# Patient Record
Sex: Male | Born: 1960 | Hispanic: No | Marital: Married | Smoking: Current every day smoker
Health system: Southern US, Community
[De-identification: ages and names within clinical notes are randomized; demographics above are authoritative.]

## PROBLEM LIST (undated history)

## (undated) ENCOUNTER — Emergency Department (HOSPITAL_COMMUNITY): Discharge status: Absent without leave | Payer: No Typology Code available for payment source

## (undated) DIAGNOSIS — E8881 Metabolic syndrome: Secondary | ICD-10-CM

## (undated) DIAGNOSIS — F17200 Nicotine dependence, unspecified, uncomplicated: Secondary | ICD-10-CM

## (undated) DIAGNOSIS — E119 Type 2 diabetes mellitus without complications: Secondary | ICD-10-CM

## (undated) DIAGNOSIS — E559 Vitamin D deficiency, unspecified: Secondary | ICD-10-CM

## (undated) DIAGNOSIS — E291 Testicular hypofunction: Secondary | ICD-10-CM

## (undated) DIAGNOSIS — E7800 Pure hypercholesterolemia, unspecified: Secondary | ICD-10-CM

## (undated) DIAGNOSIS — M1990 Unspecified osteoarthritis, unspecified site: Secondary | ICD-10-CM

## (undated) DIAGNOSIS — I10 Essential (primary) hypertension: Secondary | ICD-10-CM

## (undated) DIAGNOSIS — E669 Obesity, unspecified: Secondary | ICD-10-CM

## (undated) HISTORY — PX: COLONOSCOPY W/ BIOPSIES AND POLYPECTOMY: SHX1376

## (undated) HISTORY — PX: APPENDECTOMY: SHX54

---

## 1998-09-01 ENCOUNTER — Ambulatory Visit (HOSPITAL_BASED_OUTPATIENT_CLINIC_OR_DEPARTMENT_OTHER): Admission: RE | Admit: 1998-09-01 | Discharge: 1998-09-01 | Payer: Self-pay | Admitting: Plastic Surgery

## 2008-02-05 HISTORY — PX: ANKLE FRACTURE SURGERY: SHX122

## 2009-06-18 ENCOUNTER — Emergency Department (HOSPITAL_BASED_OUTPATIENT_CLINIC_OR_DEPARTMENT_OTHER): Admission: EM | Admit: 2009-06-18 | Discharge: 2009-06-18 | Payer: Self-pay | Admitting: Emergency Medicine

## 2009-06-18 ENCOUNTER — Ambulatory Visit: Payer: Self-pay | Admitting: Radiology

## 2009-10-04 ENCOUNTER — Encounter
Admission: RE | Admit: 2009-10-04 | Discharge: 2010-01-02 | Payer: Self-pay | Source: Home / Self Care | Admitting: Specialist

## 2010-01-03 ENCOUNTER — Encounter
Admission: RE | Admit: 2010-01-03 | Discharge: 2010-01-31 | Payer: Self-pay | Source: Home / Self Care | Attending: Specialist | Admitting: Specialist

## 2010-01-31 ENCOUNTER — Encounter
Admission: RE | Admit: 2010-01-31 | Discharge: 2010-02-26 | Payer: Self-pay | Source: Home / Self Care | Attending: Specialist | Admitting: Specialist

## 2011-12-02 ENCOUNTER — Emergency Department (HOSPITAL_BASED_OUTPATIENT_CLINIC_OR_DEPARTMENT_OTHER)
Admission: EM | Admit: 2011-12-02 | Discharge: 2011-12-02 | Disposition: A | Discharge status: Home / Self Care | Payer: Managed Care, Other (non HMO) | Attending: Emergency Medicine | Admitting: Emergency Medicine

## 2011-12-02 ENCOUNTER — Encounter (HOSPITAL_BASED_OUTPATIENT_CLINIC_OR_DEPARTMENT_OTHER): Payer: Self-pay | Admitting: Emergency Medicine

## 2011-12-02 ENCOUNTER — Emergency Department (HOSPITAL_BASED_OUTPATIENT_CLINIC_OR_DEPARTMENT_OTHER): Payer: Managed Care, Other (non HMO)

## 2011-12-02 DIAGNOSIS — F1010 Alcohol abuse, uncomplicated: Secondary | ICD-10-CM | POA: Insufficient documentation

## 2011-12-02 DIAGNOSIS — R42 Dizziness and giddiness: Secondary | ICD-10-CM | POA: Insufficient documentation

## 2011-12-02 DIAGNOSIS — K219 Gastro-esophageal reflux disease without esophagitis: Secondary | ICD-10-CM | POA: Insufficient documentation

## 2011-12-02 DIAGNOSIS — R079 Chest pain, unspecified: Secondary | ICD-10-CM | POA: Insufficient documentation

## 2011-12-02 LAB — COMPREHENSIVE METABOLIC PANEL
AST: 27 U/L (ref 0–37)
Alkaline Phosphatase: 57 U/L (ref 39–117)
CO2: 24 mEq/L (ref 19–32)
Creatinine, Ser: 0.7 mg/dL (ref 0.50–1.35)
GFR calc Af Amer: >90 mL/min (ref >90)
GFR calc non Af Amer: >90 mL/min (ref >90)
Sodium: 139 mEq/L (ref 135–145)
Total Protein: 7.1 g/dL (ref 6.0–8.3)

## 2011-12-02 LAB — CBC WITH DIFFERENTIAL/PLATELET
Basophils Absolute: 0 10*3/uL (ref 0.0–0.1)
HCT: 44 % (ref 39.0–52.0)
Hemoglobin: 15.1 g/dL (ref 13.0–17.0)
Lymphocytes Relative: 29 % (ref 12–46)
Lymphs Abs: 2.2 10*3/uL (ref 0.7–4.0)
MCH: 32.3 pg (ref 26.0–34.0)
Monocytes Absolute: 0.5 10*3/uL (ref 0.1–1.0)
Neutrophils Relative %: 63 % (ref 43–77)
Platelets: 264 10*3/uL (ref 150–400)
RBC: 4.67 MIL/uL (ref 4.22–5.81)
RDW: 13.2 % (ref 11.5–15.5)

## 2011-12-02 LAB — D-DIMER, QUANTITATIVE: D-Dimer, Quant: <0.27 ug/mL-FEU (ref 0.00–0.48)

## 2011-12-02 LAB — LIPASE, BLOOD: Lipase: 45 U/L (ref 11–59)

## 2011-12-02 LAB — TROPONIN I: Troponin I: <0.3 ng/mL (ref <0.30)

## 2011-12-02 MED ORDER — GI COCKTAIL ~~LOC~~
30.0000 mL | Freq: Once | ORAL | Status: AC
  Administered 2011-12-02: 30 mL via ORAL
  Filled 2011-12-02: qty 30

## 2011-12-02 NOTE — ED Notes (Signed)
Patient back form Xray

## 2011-12-02 NOTE — ED Provider Notes (Signed)
History     CSN: [id number]  Arrival date & time 12/02/11  0802   None     Chief Complaint  Patient presents with  . Chest Pain  . Dizziness    (Consider location/radiation/quality/duration/timing/severity/associated sxs/prior treatment) Patient is a 51 y.o. male presenting with chest pain. The history is provided by the patient.  Chest Pain The chest pain began 3 - 5 days ago. Duration of episode(s) is 3 days. Chest pain occurs constantly. The chest pain is unchanged (waxes and wanes in severity). Associated with: when he moves his arms a certain way it hurts worse. At its most intense, the pain is at 6/10. The pain is currently at 1/10. The severity of the pain is mild. The quality of the pain is described as dull (and then when it is more pronounced it is sharp). The pain radiates to the upper back. Chest pain is worsened by certain positions. Primary symptoms include dizziness. Pertinent negatives for primary symptoms include no fever, no shortness of breath, no cough, no wheezing, no abdominal pain, no nausea and no vomiting.  Dizziness does not occur with nausea, vomiting or weakness.   Pertinent negatives for associated symptoms include no lower extremity edema and no weakness. He tried nothing for the symptoms. Risk factors include smoking/tobacco exposure and male gender.  Pertinent negatives for past medical history include no CAD, no diabetes, no hyperlipidemia, no hypertension and no MI.  Pertinent negatives for family medical history include: no CAD in family.  Procedure history is negative for cardiac catheterization, stress echo and exercise treadmill test.     No past medical history on file.  No past surgical history on file.  No family history on file.  History  Substance Use Topics  . Smoking status: Not on file  . Smokeless tobacco: Not on file  . Alcohol Use: Not on file      Review of Systems  Constitutional: Negative for fever.  Respiratory:  Negative for cough, shortness of breath and wheezing.   Cardiovascular: Positive for chest pain.  Gastrointestinal: Negative for nausea, vomiting and abdominal pain.  Neurological: Positive for dizziness. Negative for weakness.  All other systems reviewed and are negative.    Allergies  Review of patient's allergies indicates not on file.  Home Medications  No current outpatient prescriptions on file.  There were no vitals taken for this visit.  Physical Exam  Nursing note and vitals reviewed. Constitutional: He is oriented to person, place, and time. He appears well-developed and well-nourished. No distress.       Central obesity  HENT:  Head: Normocephalic and atraumatic.  Mouth/Throat: Oropharynx is clear and moist.  Eyes: Conjunctivae normal and EOM are normal. Pupils are equal, round, and reactive to light.  Neck: Normal range of motion. Neck supple.  Cardiovascular: Normal rate, regular rhythm and intact distal pulses.   No murmur heard. Pulmonary/Chest: Effort normal and breath sounds normal. No respiratory distress. He has no wheezes. He has no rales. He exhibits no tenderness.  Abdominal: Soft. He exhibits no distension. There is no tenderness. There is no rebound and no guarding.  Musculoskeletal: Normal range of motion. He exhibits no edema and no tenderness.  Neurological: He is alert and oriented to person, place, and time.  Skin: Skin is warm and dry. No rash noted. No erythema.  Psychiatric: He has a normal mood and affect. His behavior is normal.    ED Course  Procedures (including critical care time)  Labs Reviewed  COMPREHENSIVE METABOLIC PANEL - Abnormal; Notable for the following:    Glucose, Bld 200 (*)     Total Bilirubin 0.1 (*)     All other components within normal limits  CBC WITH DIFFERENTIAL  LIPASE, BLOOD  D-DIMER, QUANTITATIVE  TROPONIN I  TROPONIN I   Dg Chest 2 View  12/02/2011  *RADIOLOGY REPORT*  Clinical Data: Chest pain.   Hypertension.  CHEST - 2 VIEW  Comparison: No priors.  Findings: Lung volumes are normal.  No consolidative airspace disease.  No pleural effusions.  Calcified nodule in the left mid lung (likely in the left upper lobe) is compatible with a small calcified granuloma.  No other or suspicious appearing pulmonary nodules or masses are confidently identified.  Heart size is normal.  No evidence of pulmonary edema.  Upper mediastinal contours are within normal limits.  Atherosclerosis of the thoracic aorta.  IMPRESSION: 1.  No radiographic evidence of acute cardiopulmonary disease. 2.  Atherosclerosis. 3.  Calcified granuloma in the left midlung incidentally noted.   Original Report Authenticated By: Florencia Reasons, M.D.      Date: 12/02/2011  Rate: 64  Rhythm: normal sinus rhythm, occasional PVC  QRS Axis: normal  Intervals: normal  ST/T Wave abnormalities: nonspecific ST/T changes  Conduction Disutrbances:right bundle branch block  Narrative Interpretation:   Old EKG Reviewed: none available   1. Chest pain   2. GERD (gastroesophageal reflux disease)       MDM   Patient presenting complaining of 3 days of chest pain without associated symptoms except for mild lightheadedness that he states comes and goes. The chest pain has been constant but severity waxes and wanes. He is a TIMI 0 and his only risk factor is that he is a smoker. He also admits to being a fairly heavy drinker however denies any symptom worsening with eating. He has no abdominal pain or vomiting making pancreatitis less likely. The pain does not radiate down into the abdomen or across his chest concerning for a dissection. Concern for PE versus cardiac pathology. Patient denies dark stools or bleeding and there's no sign of anemia by exam. EKG, d-dimer, CBC, CMP, lipase, troponin pending.  12:03 PM All labs and 2 troponins are negative. Patient symptoms are completely resolved after GI cocktail. Spoke with patient's PCP  and he will followup with him about the elevated blood sugar and outpatient stress testing.     Gwyneth Sprout, MD 12/02/11 1204

## 2011-12-02 NOTE — ED Notes (Signed)
Pt c/o midsternal chest pain w/ lightheadedness x 3 days.

## 2011-12-02 NOTE — ED Notes (Signed)
Patient transported to X-ray 

## 2011-12-02 NOTE — ED Notes (Signed)
MD at bedside. 

## 2012-04-01 ENCOUNTER — Encounter (HOSPITAL_COMMUNITY): Payer: Self-pay | Admitting: Anesthesiology

## 2012-04-01 ENCOUNTER — Encounter (HOSPITAL_COMMUNITY)
Admission: EM | Disposition: A | Discharge status: Skilled Nursing Facility | Payer: Self-pay | Source: Home / Self Care | Attending: Orthopedic Surgery

## 2012-04-01 ENCOUNTER — Inpatient Hospital Stay (HOSPITAL_COMMUNITY)
Admission: EM | Admit: 2012-04-01 | Discharge: 2012-04-15 | DRG: 481 | Disposition: A | Discharge status: Skilled Nursing Facility | Payer: Worker's Compensation | Attending: Orthopedic Surgery | Admitting: Orthopedic Surgery

## 2012-04-01 ENCOUNTER — Inpatient Hospital Stay: Admit: 2012-04-01 | Payer: Self-pay | Admitting: Orthopedic Surgery

## 2012-04-01 ENCOUNTER — Inpatient Hospital Stay (HOSPITAL_COMMUNITY): Payer: Worker's Compensation

## 2012-04-01 ENCOUNTER — Emergency Department (HOSPITAL_COMMUNITY): Payer: Worker's Compensation

## 2012-04-01 ENCOUNTER — Inpatient Hospital Stay (HOSPITAL_COMMUNITY): Payer: Worker's Compensation | Admitting: Anesthesiology

## 2012-04-01 ENCOUNTER — Encounter (HOSPITAL_COMMUNITY): Payer: Self-pay | Admitting: Emergency Medicine

## 2012-04-01 DIAGNOSIS — W19XXXA Unspecified fall, initial encounter: Secondary | ICD-10-CM

## 2012-04-01 DIAGNOSIS — W1789XA Other fall from one level to another, initial encounter: Secondary | ICD-10-CM | POA: Diagnosis present

## 2012-04-01 DIAGNOSIS — Y9289 Other specified places as the place of occurrence of the external cause: Secondary | ICD-10-CM

## 2012-04-01 DIAGNOSIS — S92009A Unspecified fracture of unspecified calcaneus, initial encounter for closed fracture: Secondary | ICD-10-CM | POA: Diagnosis present

## 2012-04-01 DIAGNOSIS — S72402A Unspecified fracture of lower end of left femur, initial encounter for closed fracture: Secondary | ICD-10-CM

## 2012-04-01 DIAGNOSIS — S82141A Displaced bicondylar fracture of right tibia, initial encounter for closed fracture: Secondary | ICD-10-CM

## 2012-04-01 DIAGNOSIS — E669 Obesity, unspecified: Secondary | ICD-10-CM | POA: Diagnosis present

## 2012-04-01 DIAGNOSIS — S92002A Unspecified fracture of left calcaneus, initial encounter for closed fracture: Secondary | ICD-10-CM

## 2012-04-01 DIAGNOSIS — S82101A Unspecified fracture of upper end of right tibia, initial encounter for closed fracture: Secondary | ICD-10-CM

## 2012-04-01 DIAGNOSIS — S82143A Displaced bicondylar fracture of unspecified tibia, initial encounter for closed fracture: Secondary | ICD-10-CM

## 2012-04-01 DIAGNOSIS — M25469 Effusion, unspecified knee: Secondary | ICD-10-CM | POA: Diagnosis present

## 2012-04-01 DIAGNOSIS — E236 Other disorders of pituitary gland: Secondary | ICD-10-CM | POA: Diagnosis present

## 2012-04-01 DIAGNOSIS — D62 Acute posthemorrhagic anemia: Secondary | ICD-10-CM | POA: Diagnosis not present

## 2012-04-01 DIAGNOSIS — E291 Testicular hypofunction: Secondary | ICD-10-CM

## 2012-04-01 DIAGNOSIS — S82109A Unspecified fracture of upper end of unspecified tibia, initial encounter for closed fracture: Principal | ICD-10-CM | POA: Diagnosis present

## 2012-04-01 DIAGNOSIS — F17200 Nicotine dependence, unspecified, uncomplicated: Secondary | ICD-10-CM | POA: Diagnosis present

## 2012-04-01 DIAGNOSIS — Z6833 Body mass index (BMI) 33.0-33.9, adult: Secondary | ICD-10-CM

## 2012-04-01 DIAGNOSIS — S72413A Displaced unspecified condyle fracture of lower end of unspecified femur, initial encounter for closed fracture: Secondary | ICD-10-CM | POA: Diagnosis present

## 2012-04-01 DIAGNOSIS — E559 Vitamin D deficiency, unspecified: Secondary | ICD-10-CM

## 2012-04-01 HISTORY — PX: ORIF TIBIA PLATEAU: SHX2132

## 2012-04-01 HISTORY — PX: EXTERNAL FIXATION LEG: SHX1549

## 2012-04-01 LAB — COMPREHENSIVE METABOLIC PANEL
AST: 26 U/L (ref 0–37)
Albumin: 3.8 g/dL (ref 3.5–5.2)
BUN: 12 mg/dL (ref 6–23)
CO2: 24 mEq/L (ref 19–32)
Glucose, Bld: 118 mg/dL — ABNORMAL HIGH (ref 70–99)
Potassium: 4.7 mEq/L (ref 3.5–5.1)
Sodium: 141 mEq/L (ref 135–145)
Total Protein: 7.1 g/dL (ref 6.0–8.3)

## 2012-04-01 LAB — CBC
Hemoglobin: 14.3 g/dL (ref 13.0–17.0)
Hemoglobin: 12.7 g/dL — ABNORMAL LOW (ref 13.0–17.0)
MCH: 32.6 pg (ref 26.0–34.0)
MCHC: 34.5 g/dL (ref 30.0–36.0)
MCHC: 34.1 g/dL (ref 30.0–36.0)
MCV: 95.6 fL (ref 78.0–100.0)
Platelets: 275 10*3/uL (ref 150–400)
RBC: 4.38 MIL/uL (ref 4.22–5.81)

## 2012-04-01 LAB — SAMPLE TO BLOOD BANK

## 2012-04-01 LAB — CREATININE, SERUM: Creatinine, Ser: 0.9 mg/dL (ref 0.50–1.35)

## 2012-04-01 LAB — CG4 I-STAT (LACTIC ACID): Lactic Acid, Venous: 3.28 mmol/L — ABNORMAL HIGH (ref 0.5–2.2)

## 2012-04-01 SURGERY — OPEN REDUCTION INTERNAL FIXATION (ORIF) TIBIAL PLATEAU
Anesthesia: General | Site: Leg Lower | Laterality: Right | Wound class: Clean

## 2012-04-01 MED ORDER — LABETALOL HCL 5 MG/ML IV SOLN
INTRAVENOUS | Status: AC
  Administered 2012-04-01: 10 mg
  Filled 2012-04-01: qty 8

## 2012-04-01 MED ORDER — LACTATED RINGERS IV SOLN
INTRAVENOUS | Status: DC | PRN
  Administered 2012-04-01 (×2): via INTRAVENOUS

## 2012-04-01 MED ORDER — FENTANYL CITRATE 0.05 MG/ML IJ SOLN
INTRAMUSCULAR | Status: DC | PRN
  Administered 2012-04-01 (×3): 100 ug via INTRAVENOUS

## 2012-04-01 MED ORDER — HYDROMORPHONE HCL PF 1 MG/ML IJ SOLN
1.0000 mg | Freq: Once | INTRAMUSCULAR | Status: AC
  Administered 2012-04-01: 1 mg via INTRAVENOUS

## 2012-04-01 MED ORDER — PROPOFOL 10 MG/ML IV BOLUS
INTRAVENOUS | Status: DC | PRN
  Administered 2012-04-01: 180 mg via INTRAVENOUS

## 2012-04-01 MED ORDER — ENOXAPARIN SODIUM 30 MG/0.3ML ~~LOC~~ SOLN
30.0000 mg | Freq: Two times a day (BID) | SUBCUTANEOUS | Status: DC
  Filled 2012-04-01 (×3): qty 0.3

## 2012-04-01 MED ORDER — ONDANSETRON HCL 4 MG/2ML IJ SOLN
INTRAMUSCULAR | Status: AC
  Administered 2012-04-01: 4 mg via INTRAVENOUS
  Filled 2012-04-01: qty 2

## 2012-04-01 MED ORDER — HYDROMORPHONE HCL PF 1 MG/ML IJ SOLN
0.2500 mg | INTRAMUSCULAR | Status: DC | PRN
  Administered 2012-04-01: 0.5 mg via INTRAVENOUS

## 2012-04-01 MED ORDER — DEXTROSE 5 % IV SOLN
INTRAVENOUS | Status: DC | PRN
  Administered 2012-04-01: 18:00:00 via INTRAVENOUS

## 2012-04-01 MED ORDER — EPHEDRINE SULFATE 50 MG/ML IJ SOLN
INTRAMUSCULAR | Status: DC | PRN
  Administered 2012-04-01: 5 mg via INTRAVENOUS

## 2012-04-01 MED ORDER — OXYCODONE HCL 5 MG PO TABS
5.0000 mg | ORAL_TABLET | ORAL | Status: DC | PRN
  Administered 2012-04-01: 5 mg via ORAL
  Filled 2012-04-01: qty 1

## 2012-04-01 MED ORDER — METOCLOPRAMIDE HCL 5 MG/ML IJ SOLN: 5.0000 mg | Freq: Three times a day (TID) | INTRAMUSCULAR | Status: DC | PRN

## 2012-04-01 MED ORDER — BISACODYL 5 MG PO TBEC
5.0000 mg | DELAYED_RELEASE_TABLET | Freq: Every day | ORAL | Status: DC | PRN
  Administered 2012-04-04 – 2012-04-07 (×3): 5 mg via ORAL
  Filled 2012-04-01 (×3): qty 1

## 2012-04-01 MED ORDER — SUCCINYLCHOLINE CHLORIDE 20 MG/ML IJ SOLN
INTRAMUSCULAR | Status: DC | PRN
  Administered 2012-04-01: 120 mg via INTRAVENOUS

## 2012-04-01 MED ORDER — METOCLOPRAMIDE HCL 5 MG PO TABS
5.0000 mg | ORAL_TABLET | Freq: Three times a day (TID) | ORAL | Status: DC | PRN
  Filled 2012-04-01: qty 2

## 2012-04-01 MED ORDER — LIDOCAINE HCL (CARDIAC) 20 MG/ML IV SOLN
INTRAVENOUS | Status: DC | PRN
  Administered 2012-04-01: 80 mg via INTRAVENOUS

## 2012-04-01 MED ORDER — OXYCODONE HCL 5 MG PO TABS: 5.0000 mg | ORAL_TABLET | Freq: Once | ORAL | Status: DC | PRN

## 2012-04-01 MED ORDER — HYDROMORPHONE HCL PF 1 MG/ML IJ SOLN
1.0000 mg | Freq: Once | INTRAMUSCULAR | Status: AC
  Administered 2012-04-01: 1 mg via INTRAVENOUS
  Filled 2012-04-01: qty 1

## 2012-04-01 MED ORDER — MIDAZOLAM HCL 5 MG/5ML IJ SOLN
INTRAMUSCULAR | Status: DC | PRN
  Administered 2012-04-01: 2 mg via INTRAVENOUS

## 2012-04-01 MED ORDER — DOCUSATE SODIUM 100 MG PO CAPS
100.0000 mg | ORAL_CAPSULE | Freq: Two times a day (BID) | ORAL | Status: DC
  Administered 2012-04-01 – 2012-04-10 (×16): 100 mg via ORAL
  Filled 2012-04-01 (×31): qty 1

## 2012-04-01 MED ORDER — OXYCODONE HCL 5 MG/5ML PO SOLN: 5.0000 mg | Freq: Once | ORAL | Status: DC | PRN

## 2012-04-01 MED ORDER — CEFAZOLIN SODIUM-DEXTROSE 2-3 GM-% IV SOLR
2.0000 g | Freq: Four times a day (QID) | INTRAVENOUS | Status: AC
  Administered 2012-04-01 – 2012-04-02 (×3): 2 g via INTRAVENOUS
  Filled 2012-04-01 (×5): qty 50

## 2012-04-01 MED ORDER — OXYCODONE-ACETAMINOPHEN 5-325 MG PO TABS
1.0000 | ORAL_TABLET | ORAL | Status: DC | PRN
  Administered 2012-04-01 – 2012-04-02 (×2): 1 via ORAL
  Administered 2012-04-02: 2 via ORAL
  Filled 2012-04-01: qty 1
  Filled 2012-04-01: qty 2
  Filled 2012-04-01: qty 1

## 2012-04-01 MED ORDER — HYDROMORPHONE HCL PF 1 MG/ML IJ SOLN
INTRAMUSCULAR | Status: AC
  Administered 2012-04-01: 1 mg via INTRAVENOUS
  Filled 2012-04-01: qty 1

## 2012-04-01 MED ORDER — HYDROMORPHONE HCL PF 1 MG/ML IJ SOLN
0.5000 mg | Freq: Once | INTRAMUSCULAR | Status: AC
  Administered 2012-04-01: 11:00:00 via INTRAVENOUS
  Filled 2012-04-01: qty 1

## 2012-04-01 MED ORDER — HYDROMORPHONE HCL PF 1 MG/ML IJ SOLN
INTRAMUSCULAR | Status: AC
  Administered 2012-04-01: 0.5 mg via INTRAVENOUS
  Filled 2012-04-01: qty 1

## 2012-04-01 MED ORDER — LABETALOL HCL 5 MG/ML IV SOLN: 10.0000 mg | INTRAVENOUS | Status: DC | PRN

## 2012-04-01 MED ORDER — DEXAMETHASONE SODIUM PHOSPHATE 4 MG/ML IJ SOLN
INTRAMUSCULAR | Status: DC | PRN
  Administered 2012-04-01: 8 mg via INTRAVENOUS

## 2012-04-01 MED ORDER — ONDANSETRON HCL 4 MG/2ML IJ SOLN
4.0000 mg | Freq: Once | INTRAMUSCULAR | Status: AC
  Administered 2012-04-01: 4 mg via INTRAVENOUS

## 2012-04-01 MED ORDER — SENNOSIDES-DOCUSATE SODIUM 8.6-50 MG PO TABS: 1.0000 | ORAL_TABLET | Freq: Every evening | ORAL | Status: DC | PRN

## 2012-04-01 MED ORDER — MORPHINE SULFATE 2 MG/ML IJ SOLN
2.0000 mg | INTRAMUSCULAR | Status: DC | PRN
  Administered 2012-04-01 (×4): 2 mg via INTRAVENOUS
  Filled 2012-04-01 (×4): qty 1

## 2012-04-01 MED ORDER — NEOSTIGMINE METHYLSULFATE 1 MG/ML IJ SOLN
INTRAMUSCULAR | Status: DC | PRN
  Administered 2012-04-01: 5 mg via INTRAVENOUS

## 2012-04-01 MED ORDER — METOPROLOL SUCCINATE ER 25 MG PO TB24
25.0000 mg | ORAL_TABLET | Freq: Once | ORAL | Status: AC
  Administered 2012-04-01: 25 mg via ORAL
  Filled 2012-04-01 (×2): qty 1

## 2012-04-01 MED ORDER — HYDROMORPHONE HCL PF 1 MG/ML IJ SOLN: 0.2500 mg | INTRAMUSCULAR | Status: DC | PRN

## 2012-04-01 MED ORDER — GLYCOPYRROLATE 0.2 MG/ML IJ SOLN
INTRAMUSCULAR | Status: DC | PRN
  Administered 2012-04-01: 0.6 mg via INTRAVENOUS

## 2012-04-01 MED ORDER — ONDANSETRON HCL 4 MG/2ML IJ SOLN
4.0000 mg | Freq: Four times a day (QID) | INTRAMUSCULAR | Status: DC | PRN
Start: 2012-04-01 — End: 2012-04-01

## 2012-04-01 MED ORDER — ONDANSETRON HCL 4 MG PO TABS: 4.0000 mg | ORAL_TABLET | Freq: Four times a day (QID) | ORAL | Status: DC | PRN

## 2012-04-01 MED ORDER — FLEET ENEMA 7-19 GM/118ML RE ENEM: 1.0000 | ENEMA | Freq: Once | RECTAL | Status: AC | PRN

## 2012-04-01 MED ORDER — ONDANSETRON HCL 4 MG/2ML IJ SOLN
INTRAMUSCULAR | Status: DC | PRN
  Administered 2012-04-01: 4 mg via INTRAVENOUS

## 2012-04-01 MED ORDER — OXYCODONE HCL 5 MG PO TABS
5.0000 mg | ORAL_TABLET | ORAL | Status: DC | PRN
  Administered 2012-04-02: 10 mg via ORAL
  Filled 2012-04-01: qty 2

## 2012-04-01 MED ORDER — MORPHINE SULFATE 2 MG/ML IJ SOLN
2.0000 mg | INTRAMUSCULAR | Status: DC | PRN
  Administered 2012-04-02: 2 mg via INTRAVENOUS
  Filled 2012-04-01: qty 1

## 2012-04-01 MED ORDER — LACTATED RINGERS IV SOLN
INTRAVENOUS | Status: DC
  Administered 2012-04-01: 50 mL/h via INTRAVENOUS
  Administered 2012-04-01: 13:00:00 via INTRAVENOUS

## 2012-04-01 MED ORDER — VECURONIUM BROMIDE 10 MG IV SOLR
INTRAVENOUS | Status: DC | PRN
  Administered 2012-04-01: 5 mg via INTRAVENOUS

## 2012-04-01 MED ORDER — ARTIFICIAL TEARS OP OINT
TOPICAL_OINTMENT | OPHTHALMIC | Status: DC | PRN
  Administered 2012-04-01: 1 via OPHTHALMIC

## 2012-04-01 MED ORDER — LACTATED RINGERS IV SOLN
INTRAVENOUS | Status: DC
  Administered 2012-04-01 – 2012-04-02 (×2): via INTRAVENOUS

## 2012-04-01 MED ORDER — CEFAZOLIN SODIUM-DEXTROSE 2-3 GM-% IV SOLR
2.0000 g | Freq: Once | INTRAVENOUS | Status: AC
  Administered 2012-04-01: 2 g via INTRAVENOUS

## 2012-04-01 MED ORDER — ONDANSETRON HCL 4 MG/2ML IJ SOLN: 4.0000 mg | Freq: Four times a day (QID) | INTRAMUSCULAR | Status: DC | PRN

## 2012-04-01 MED ORDER — ACETAMINOPHEN 10 MG/ML IV SOLN
INTRAVENOUS | Status: DC | PRN
  Administered 2012-04-01: 1000 mg via INTRAVENOUS

## 2012-04-01 SURGICAL SUPPLY — 53 items
11 x 500mm bar ×2 IMPLANT
5x200x65mm half-pin ×2 IMPLANT
75mm 2-bar pin clamp ×2 IMPLANT
BANDAGE ELASTIC 6 VELCRO ST LF (GAUZE/BANDAGES/DRESSINGS) ×2 IMPLANT
BANDAGE ESMARK 6X9 LF (GAUZE/BANDAGES/DRESSINGS) ×1 IMPLANT
BANDAGE GAUZE ELAST BULKY 4 IN (GAUZE/BANDAGES/DRESSINGS) ×1 IMPLANT
BLADE SURG 10 STRL SS (BLADE) ×2 IMPLANT
BLADE SURG ROTATE 9660 (MISCELLANEOUS) IMPLANT
BNDG CMPR 9X6 STRL LF SNTH (GAUZE/BANDAGES/DRESSINGS) ×1
BNDG COHESIVE 4X5 TAN STRL (GAUZE/BANDAGES/DRESSINGS) ×2 IMPLANT
BNDG COHESIVE 6X5 TAN STRL LF (GAUZE/BANDAGES/DRESSINGS) ×1 IMPLANT
BNDG ESMARK 6X9 LF (GAUZE/BANDAGES/DRESSINGS) ×2
CLOTH BEACON ORANGE TIMEOUT ST (SAFETY) ×2 IMPLANT
COVER MAYO STAND STRL (DRAPES) ×2 IMPLANT
COVER SURGICAL LIGHT HANDLE (MISCELLANEOUS) ×2 IMPLANT
CUFF TOURNIQUET SINGLE 34IN LL (TOURNIQUET CUFF) ×1 IMPLANT
CUFF TOURNIQUET SINGLE 44IN (TOURNIQUET CUFF) IMPLANT
DRAPE OEC MINIVIEW 54X84 (DRAPES) ×1 IMPLANT
DRAPE U-SHAPE 47X51 STRL (DRAPES) ×2 IMPLANT
DURAPREP 26ML APPLICATOR (WOUND CARE) ×2 IMPLANT
ELECT REM PT RETURN 9FT ADLT (ELECTROSURGICAL) ×2
ELECTRODE REM PT RTRN 9FT ADLT (ELECTROSURGICAL) ×1 IMPLANT
GAUZE XEROFORM 1X8 LF (GAUZE/BANDAGES/DRESSINGS) ×2 IMPLANT
GLOVE BIO SURGEON STRL SZ7 (GLOVE) ×2 IMPLANT
GLOVE BIO SURGEON STRL SZ7.5 (GLOVE) ×2 IMPLANT
GLOVE BIOGEL PI IND STRL 8 (GLOVE) ×1 IMPLANT
GLOVE BIOGEL PI INDICATOR 8 (GLOVE) ×1
GOWN PREVENTION PLUS LG XLONG (DISPOSABLE) ×2 IMPLANT
GOWN PREVENTION PLUS XLARGE (GOWN DISPOSABLE) ×2 IMPLANT
GOWN STRL NON-REIN LRG LVL3 (GOWN DISPOSABLE) ×4 IMPLANT
KIT BASIN OR (CUSTOM PROCEDURE TRAY) ×2 IMPLANT
KIT ROOM TURNOVER OR (KITS) ×2 IMPLANT
MANIFOLD NEPTUNE II (INSTRUMENTS) ×2 IMPLANT
NEEDLE 22X1 1/2 (OR ONLY) (NEEDLE) IMPLANT
NS IRRIG 1000ML POUR BTL (IV SOLUTION) ×2 IMPLANT
PACK ORTHO EXTREMITY (CUSTOM PROCEDURE TRAY) ×2 IMPLANT
PAD ARMBOARD 7.5X6 YLW CONV (MISCELLANEOUS) ×4 IMPLANT
PAD CAST 4YDX4 CTTN HI CHSV (CAST SUPPLIES) ×1 IMPLANT
PADDING CAST COTTON 4X4 STRL (CAST SUPPLIES) ×2
SPONGE GAUZE 4X4 12PLY (GAUZE/BANDAGES/DRESSINGS) ×2 IMPLANT
SPONGE LAP 4X18 X RAY DECT (DISPOSABLE) ×4 IMPLANT
STAPLER VISISTAT 35W (STAPLE) IMPLANT
SUCTION FRAZIER TIP 10 FR DISP (SUCTIONS) ×2 IMPLANT
SUT ETHILON 4 0 PS 2 18 (SUTURE) IMPLANT
SUT VIC AB 0 CTB1 27 (SUTURE) IMPLANT
SUT VIC AB 2-0 FS1 27 (SUTURE) IMPLANT
SYR CONTROL 10ML LL (SYRINGE) IMPLANT
TOWEL OR 17X24 6PK STRL BLUE (TOWEL DISPOSABLE) ×2 IMPLANT
TOWEL OR 17X26 10 PK STRL BLUE (TOWEL DISPOSABLE) ×2 IMPLANT
TRAY FOLEY CATH 14FR (SET/KITS/TRAYS/PACK) ×1 IMPLANT
TUBE CONNECTING 12X1/4 (SUCTIONS) ×2 IMPLANT
WATER STERILE IRR 1000ML POUR (IV SOLUTION) ×2 IMPLANT
yellow 5 x 160x55mm half pin ×2 IMPLANT

## 2012-04-01 NOTE — Anesthesia Postprocedure Evaluation (Signed)
Anesthesia Post Note  Patient: [name] [name]  Procedure(s) Performed: Procedure(s) (LRB): TIBIAL PLATEAU/External Fixation (Right)  Anesthesia type: General  Patient location: PACU  Post pain: Pain level controlled and Adequate analgesia  Post assessment: Post-op Vital signs reviewed, Patient's Cardiovascular Status Stable, Respiratory Function Stable, Patent Airway and Pain level controlled  Last Vitals:  Filed Vitals:   04/01/12 2000  BP: 100/71  Pulse: 67  Temp:   Resp: 14    Post vital signs: Reviewed and stable  Level of consciousness: awake, alert  and oriented  Complications: No apparent anesthesia complications

## 2012-04-01 NOTE — H&P (Signed)
Reason for Consult:evaluate bilateral lower extremity injuries Referring Physician: Wentz  [name] [name] is an 51 y.o. male.  HPI: the patient is a 51-year-old male who fell off the loading gait the proximal this morning and fell about 5 feet onto both legs. He had immediate severe pain in both legs. He was unable to ambulate. He was taken to the [place] emergency department where x-rays revealed fractures in both lower extremities. I was consult to for evaluation and management. At this point pain is severe. It's worse with movement, better with rest. He has had hydromorphone which helps with pain.  History reviewed. No pertinent past medical history.  Past Surgical History  Procedure Laterality Date  . Ankle fracture surgery  2010    No family history on file.  Social History:  reports that he has been smoking Cigarettes.  He has been smoking about 0.00 packs per day. He does not have any smokeless tobacco history on file. He reports that  drinks alcohol. He reports that he does not use illicit drugs.  Allergies: No Known Allergies  Medications: Prior to Admission:  (Not in a hospital admission)  Results for orders placed during the hospital encounter of 04/01/12 (from the past 48 hour(s))  SAMPLE TO BLOOD BANK     Status: None   Collection Time    04/01/12  9:15 AM      Result Value Range   Blood Bank Specimen SAMPLE AVAILABLE FOR TESTING     Sample Expiration 04/02/2012    COMPREHENSIVE METABOLIC PANEL     Status: Abnormal   Collection Time    04/01/12  9:40 AM      Result Value Range   Sodium 141  135 - 145 mEq/L   Potassium 4.7  3.5 - 5.1 mEq/L   Chloride 103  96 - 112 mEq/L   CO2 24  19 - 32 mEq/L   Glucose, Bld 118 (*) 70 - 99 mg/dL   BUN 12  6 - 23 mg/dL   Creatinine, Ser 0.68  0.50 - 1.35 mg/dL   Calcium 10.0  8.4 - 10.5 mg/dL   Total Protein 7.1  6.0 - 8.3 g/dL   Albumin 3.8  3.5 - 5.2 g/dL   AST 26  0 - 37 U/L   ALT 28  0 - 53 U/L   Alkaline Phosphatase 57   39 - 117 U/L   Total Bilirubin 0.2 (*) 0.3 - 1.2 mg/dL   GFR calc non Af Amer >90  >90 mL/min   GFR calc Af Amer >90  >90 mL/min   Comment:            The eGFR has been calculated     using the CKD EPI equation.     This calculation has not been     validated in all clinical     situations.     eGFR's persistently     <90 mL/min signify     possible Chronic Kidney Disease.  CBC     Status: None   Collection Time    04/01/12  9:40 AM      Result Value Range   WBC 8.2  4.0 - 10.5 K/uL   RBC 5.05  4.22 - 5.81 MIL/uL   Hemoglobin 16.2  13.0 - 17.0 g/dL   HCT 47.8  39.0 - 52.0 %   MCV 94.7  78.0 - 100.0 fL   MCH 32.1  26.0 - 34.0 pg   MCHC 33.9  30.0 -   36.0 g/dL   RDW 13.6  11.5 - 15.5 %   Platelets 285  150 - 400 K/uL  PROTIME-INR     Status: None   Collection Time    04/01/12  9:40 AM      Result Value Range   Prothrombin Time 12.1  11.6 - 15.2 seconds   INR 0.90  0.00 - 1.49  CG4 I-STAT (LACTIC ACID)     Status: Abnormal   Collection Time    04/01/12 10:16 AM      Result Value Range   Lactic Acid, Venous 3.28 (*) 0.5 - 2.2 mmol/L    Dg Chest 1 View  04/01/2012  *RADIOLOGY REPORT*  Clinical Data: Fall.  CHEST - 1 VIEW  Comparison: None.  Findings: Punctate calcified granuloma on the left is again seen. Lungs otherwise clear.  Heart size is enlarged.  No pneumothorax or pleural effusion.  IMPRESSION: Cardiomegaly without acute disease.   Original Report Authenticated By: Thomas D'Alessio, M.D.    Dg Tibia/fibula Left  04/01/2012  *RADIOLOGY REPORT*  Clinical Data: Fall, pain.  LEFT TIBIA AND FIBULA - 2 VIEW  Comparison: None.  Findings: The patient has a fracture of the distal femur.  The fracture is vertical in orientation extending from the lateral metadiaphysis through the intercondylar notch.  There is no notable displacement.  No other fracture about the knee is seen.  The patient has a comminuted fracture of the calcaneus involving the middle facet with depression.  Old  healed ankle fractures with hardware in place are noted.  IMPRESSION:  1.  Nondisplaced distal femur fracture as described. 2.  Acute comminuted central depression type fracture of the calcaneus. 3.  Old healed medial and lateral malleolar fractures with hardware in place.   Original Report Authenticated By: Thomas D'Alessio, M.D.    Dg Tibia/fibula Right  04/01/2012  *RADIOLOGY REPORT*  Clinical Data: Fall, pain.  RIGHT TIBIA AND FIBULA - 2 VIEW  Comparison: None.  Findings: The patient has a highly comminuted and distracted fracture of the right tibial plateau.  The fracture is impacted. Associated lipohemarthrosis of the knee is identified.  No other fracture is seen.  IMPRESSION: Comminuted and distracted right tibial plateau fracture.   Original Report Authenticated By: Thomas D'Alessio, M.D.     Review of Systems  Unable to perform ROS: acuity of condition   Blood pressure 170/99, pulse 72, temperature 98.2 F (36.8 C), temperature source Oral, resp. rate 16, SpO2 97.00%. Physical Exam  Constitutional: He is oriented to person, place, and time. He appears well-developed and well-nourished.  HENT:  Head: Atraumatic.  Eyes: EOM are normal.  Cardiovascular: Intact distal pulses.   Respiratory: Effort normal.  Musculoskeletal:  Bilateral upper extremities are atraumatic. Right lower extremity demonstrates significant swelling of the knee and calf. Compartments are full but compressible. He has no pain with passive stretch of his toes or ankle distally. Skin is intact. He has a large knee effusion. Distally no tenderness about the ankle on the right side. He is neurovascularly intact.  Examination of the left lower extremity demonstrates tenderness about the distal femur. He has a large knee effusion. No tenderness about the tibia or calf. Mild tenderness over the medial and lateral malleolus the ankle in the area of his previous surgery. Distally neurovascularly intact  Neurological: He is  alert and oriented to person, place, and time.  Skin: Skin is warm and dry.  Psychiatric: He has a normal mood and affect.    Assessment/Plan: #  1 right tibial plateau comminuted intra-articular fracture #2 left distal femur lateral condyle interarticular fracture  We will further image the left femur and left ankle to further categorize his injuries. He may need a CT scan of the left knee to evaluate the distal femur pending further x-rays. For now we will put a knee immobilizer on the left lower extremity for comfort.  With regards to his right lower extremity he has a severely comminuted injury which already shows some shortening. He has significant swelling and will likely need delayed ORIF. he will need external fixation soon to prevent shortening and promote alignment. He'll get a CT scan after the external fixator. In the meantime we will monitor his compartments. He currently does not have clinical compartment syndrome but may be at risk for it. He will be put in a soft wrap and knee immobilizer on the right side to control swelling and pain. this afternoon he will be taken to the operating room for external fixation and at that time if there is concern of compartment syndrome we can do fasciotomies.  He will be admitted to the orthopedic service. He will remain n.p.o. For now. No DVT prophylaxis for now in anticipation of surgery this afternoon.  Ahmadou Bolz WILLIAM 04/01/2012, 12:23 PM      

## 2012-04-01 NOTE — Consult Note (Signed)
Reason for Consult:evaluate bilateral lower extremity injuries Referring Physician: Bernie Ransford is an 52 y.o. male.  HPI: the patient is a 52 year old male who fell off the loading gait the proximal this morning and fell about 5 feet onto both legs. He had immediate severe pain in both legs. He was unable to ambulate. He was taken to the [facility] emergency department where x-rays revealed fractures in both lower extremities. I was consult to for evaluation and management. At this point pain is severe. It's worse with movement, better with rest. He has had hydromorphone which helps with pain.  History reviewed. No pertinent past medical history.  Past Surgical History  Procedure Laterality Date  . Ankle fracture surgery  2010    No family history on file.  Social History:  reports that he has been smoking Cigarettes.  He has been smoking about 0.00 packs per day. He does not have any smokeless tobacco history on file. He reports that  drinks alcohol. He reports that he does not use illicit drugs.  Allergies: No Known Allergies  Medications: Prior to Admission:  (Not in a hospital admission)  Results for orders placed during the hospital encounter of 04/01/12 (from the past 48 hour(s))  SAMPLE TO BLOOD BANK     Status: None   Collection Time    04/01/12  9:15 AM      Result Value Range   Blood Bank Specimen SAMPLE AVAILABLE FOR TESTING     Sample Expiration 04/02/2012    COMPREHENSIVE METABOLIC PANEL     Status: Abnormal   Collection Time    04/01/12  9:40 AM      Result Value Range   Sodium 141  135 - 145 mEq/L   Potassium 4.7  3.5 - 5.1 mEq/L   Chloride 103  96 - 112 mEq/L   CO2 24  19 - 32 mEq/L   Glucose, Bld 118 (*) 70 - 99 mg/dL   BUN 12  6 - 23 mg/dL   Creatinine, Ser 1.61  0.50 - 1.35 mg/dL   Calcium 09.6  8.4 - 04.5 mg/dL   Total Protein 7.1  6.0 - 8.3 g/dL   Albumin 3.8  3.5 - 5.2 g/dL   AST 26  0 - 37 U/L   ALT 28  0 - 53 U/L   Alkaline Phosphatase 57   39 - 117 U/L   Total Bilirubin 0.2 (*) 0.3 - 1.2 mg/dL   GFR calc non Af Amer >90  >90 mL/min   GFR calc Af Amer >90  >90 mL/min   Comment:            The eGFR has been calculated     using the CKD EPI equation.     This calculation has not been     validated in all clinical     situations.     eGFR's persistently     <90 mL/min signify     possible Chronic Kidney Disease.  CBC     Status: None   Collection Time    04/01/12  9:40 AM      Result Value Range   WBC 8.2  4.0 - 10.5 K/uL   RBC 5.05  4.22 - 5.81 MIL/uL   Hemoglobin 16.2  13.0 - 17.0 g/dL   HCT 40.9  81.1 - 91.4 %   MCV 94.7  78.0 - 100.0 fL   MCH 32.1  26.0 - 34.0 pg   MCHC 33.9  30.0 -  36.0 g/dL   RDW 96.0  45.4 - 09.8 %   Platelets 285  150 - 400 K/uL  PROTIME-INR     Status: None   Collection Time    04/01/12  9:40 AM      Result Value Range   Prothrombin Time 12.1  11.6 - 15.2 seconds   INR 0.90  0.00 - 1.49  CG4 I-STAT (LACTIC ACID)     Status: Abnormal   Collection Time    04/01/12 10:16 AM      Result Value Range   Lactic Acid, Venous 3.28 (*) 0.5 - 2.2 mmol/L    Dg Chest 1 View  04/01/2012  *RADIOLOGY REPORT*  Clinical Data: Fall.  CHEST - 1 VIEW  Comparison: None.  Findings: Punctate calcified granuloma on the left is again seen. Lungs otherwise clear.  Heart size is enlarged.  No pneumothorax or pleural effusion.  IMPRESSION: Cardiomegaly without acute disease.   Original Report Authenticated By: Holley Dexter, M.D.    Dg Tibia/fibula Left  04/01/2012  *RADIOLOGY REPORT*  Clinical Data: Fall, pain.  LEFT TIBIA AND FIBULA - 2 VIEW  Comparison: None.  Findings: The patient has a fracture of the distal femur.  The fracture is vertical in orientation extending from the lateral metadiaphysis through the intercondylar notch.  There is no notable displacement.  No other fracture about the knee is seen.  The patient has a comminuted fracture of the calcaneus involving the middle facet with depression.  Old  healed ankle fractures with hardware in place are noted.  IMPRESSION:  1.  Nondisplaced distal femur fracture as described. 2.  Acute comminuted central depression type fracture of the calcaneus. 3.  Old healed medial and lateral malleolar fractures with hardware in place.   Original Report Authenticated By: Holley Dexter, M.D.    Dg Tibia/fibula Right  04/01/2012  *RADIOLOGY REPORT*  Clinical Data: Fall, pain.  RIGHT TIBIA AND FIBULA - 2 VIEW  Comparison: None.  Findings: The patient has a highly comminuted and distracted fracture of the right tibial plateau.  The fracture is impacted. Associated lipohemarthrosis of the knee is identified.  No other fracture is seen.  IMPRESSION: Comminuted and distracted right tibial plateau fracture.   Original Report Authenticated By: Holley Dexter, M.D.     Review of Systems  Unable to perform ROS: acuity of condition   Blood pressure 170/99, pulse 72, temperature 98.2 F (36.8 C), temperature source Oral, resp. rate 16, SpO2 97.00%. Physical Exam  Constitutional: He is oriented to person, place, and time. He appears well-developed and well-nourished.  HENT:  Head: Atraumatic.  Eyes: EOM are normal.  Cardiovascular: Intact distal pulses.   Respiratory: Effort normal.  Musculoskeletal:  Bilateral upper extremities are atraumatic. Right lower extremity demonstrates significant swelling of the knee and calf. Compartments are full but compressible. He has no pain with passive stretch of his toes or ankle distally. Skin is intact. He has a large knee effusion. Distally no tenderness about the ankle on the right side. He is neurovascularly intact.  Examination of the left lower extremity demonstrates tenderness about the distal femur. He has a large knee effusion. No tenderness about the tibia or calf. Mild tenderness over the medial and lateral malleolus the ankle in the area of his previous surgery. Distally neurovascularly intact  Neurological: He is  alert and oriented to person, place, and time.  Skin: Skin is warm and dry.  Psychiatric: He has a normal mood and affect.    Assessment/Plan: #  1 right tibial plateau comminuted intra-articular fracture #2 left distal femur lateral condyle interarticular fracture  We will further image the left femur and left ankle to further categorize his injuries. He may need a CT scan of the left knee to evaluate the distal femur pending further x-rays. For now we will put a knee immobilizer on the left lower extremity for comfort.  With regards to his right lower extremity he has a severely comminuted injury which already shows some shortening. He has significant swelling and will likely need delayed ORIF. he will need external fixation soon to prevent shortening and promote alignment. He'll get a CT scan after the external fixator. In the meantime we will monitor his compartments. He currently does not have clinical compartment syndrome but may be at risk for it. He will be put in a soft wrap and knee immobilizer on the right side to control swelling and pain. this afternoon he will be taken to the operating room for external fixation and at that time if there is concern of compartment syndrome we can do fasciotomies.  He will be admitted to the orthopedic service. He will remain n.p.o. For now. No DVT prophylaxis for now in anticipation of surgery this afternoon.  [name] 04/01/2012, 12:23 PM

## 2012-04-01 NOTE — ED Notes (Signed)
Patient fell off lift gate back of a truck landing on bilateral feet.  Pain bilateral lower extremities with obvious deformity. Pain upon arrival 10/10 sharp EMS gave fentanyl 200 mcg prior to arrival.

## 2012-04-01 NOTE — Op Note (Signed)
Procedure(s): TIBIAL PLATEAU/External Fixation Procedure Note  [name] [name] male 52 y.o. 04/01/2012  Procedure(s) and Anesthesia Type:    * R TIBIAL PLATEAU/External Fixation - General      Splint application R calcaneus fracture  Surgeon(s) and Role:    * [name], MD - Primary   Indications:  52 y.o. male s/p fall off of a 5 foot truck with right bicondylar comminuted tibial plateau fracture, left distal femur intra-articular fracture, left comminuted calcaneus fracture. Indicated for external fixation of the left lower extremity to restore length and alignment grossly in anticipation of future need surgery. Preoperatively in the emergency department compartments were felt to be full but compressible and he had no clinical evidence of compartment syndrome. In the preoperative holding area he was again examined. He had no pain with passive stretch of the toes or ankle. We talked about the possibility of fasciotomies, but again he did not have clinical compartment syndrome.      Surgeon: [name]   Assistants: [name] PA-C [facility] was present and scrubbed throughout the procedure and was essential in positioning, retraction, exposure, and closure)  Anesthesia: General endotracheal anesthesia     Procedure Detail  TIBIAL PLATEAU/External Fixation  Findings: A spanning external fixator was placed with 2 half pins in the femur proximally and 2 half pins distal to the fracture and the tibia. This provided acceptable length and alignment of the fracture site. There was significant comminution. He was developing small fracture blisters anteriorly by the time the surgery was performed. After placement of the external fixator it was felt that his compartments were full but soft and compressible. Given the lack of preoperative clinical evidence of compartment syndrome, fasciotomies were not performed .  Estimated Blood Loss:  Minimal          Drains: none  Blood Given: none         Specimens: none        Complications:  * No complications entered in OR log *         Disposition: PACU - hemodynamically stable.         Condition: stable    Procedure:  The patient was identified in the preoperative  holding area where I personally marked the operative site after  verifying site side and procedure with the patient. The patient was taken back  to the operating room where general anesthesia was induced without  Complication. The patient was kept in the supine position. The right lower extremity was prepped and draped in standard sterile fashion. The appropriate timeout procedure was carried out. 2 half pins were placed on power in the anterior tibia just off the medial crest obtaining bicortical purchase. 2 longer half pins were placed in the midshaft of the femur after making small stab incisions, spreading down the hemostat and placing the pins on power. Fluoroscopic imaging demonstrated appropriate position of the pins. Pin banks were placed proximally and distally and 2 long bars were placed. Traction and appropriate alignment were held while the external fixator was tightened. This was done under fluoroscopic guidance. Gross alignment was acceptable. There was significant comminution which could not all be anatomically reduced externally. At this point a compartments were again examined and felt to be soft and compressible but full. The decision was made at this point not to proceed with fasciotomies. Sterile dressings were placed around the incision sites. The drapes were then removed.  At this point the left lower extremity distally was placed in  a bulky Jones dressing for the calcaneus fracture and a knee immobilizer for the distal femur fracture.  The patient was then awakened from anesthesia transferred to the stretcher and taken to the recovery room in stable condition.  Postoperative plan: Patient will be admitted  overnight. He will be observed and pain control. He will have CT scans done of the right knee, left distal femur, left calcaneus. Dr. Carola Frost will be involved in the patient's care starting tomorrow morning given the advanced nature of his traumatic injuries. He will be started on Lovenox.

## 2012-04-01 NOTE — Transfer of Care (Signed)
Immediate Anesthesia Transfer of Care Note  Patient: [name] [name]  Procedure(s) Performed: Procedure(s): TIBIAL PLATEAU/External Fixation (Right)  Patient Location: PACU  Anesthesia Type:General  Level of Consciousness: oriented, sedated, patient cooperative and responds to stimulation  Airway & Oxygen Therapy: Patient Spontanous Breathing and Patient connected to face mask oxygen  Post-op Assessment: Report given to PACU RN, Post -op Vital signs reviewed and stable, Patient moving all extremities and Patient moving all extremities X 4  Post vital signs: Reviewed and stable  Complications: No apparent anesthesia complications

## 2012-04-01 NOTE — ED Notes (Signed)
Patient asked to notify wife. Called wife at her place of employment and will arrive shortly to the ED. Notified patient.

## 2012-04-01 NOTE — Progress Notes (Signed)
Orthopedic Tech Progress Note Patient Details:  [name] [name] [birth year] [id number]  Ortho Devices Type of Ortho Device: Knee Immobilizer Ortho Device/Splint Location: BILATERAL KNEE IMMOBILIZERS Ortho Device/Splint Interventions: Application   Cammer, Mickie Bail 04/01/2012, 1:53 PM

## 2012-04-01 NOTE — ED Notes (Signed)
Able to doppler bilateral pedal pulses. Right leg is larger than the left leg. Bilateral legs are warm and pink.

## 2012-04-01 NOTE — Progress Notes (Signed)
Responded to page to provide emotional and spiritual support to pt. That came in as level 2 Tib Fib after a fall. Pt. Requested that his wife be called at work  But did not know number attempt made no answer.  Will follow as needed.  04/01/12 0900  Clinical Encounter Type  Visited With Family;Health care provider  Visit Type Spiritual support  Referral From Nurse  Spiritual Encounters  Spiritual Needs Emotional  Stress Factors  Patient Stress Factors Exhausted

## 2012-04-01 NOTE — ED Notes (Signed)
Prior to arrival per EMS c-collar applied.

## 2012-04-01 NOTE — Anesthesia Procedure Notes (Signed)
Procedure Name: Intubation Date/Time: 04/01/2012 5:41 PM Performed by: Wray Kearns A Pre-anesthesia Checklist: Patient identified, Emergency Drugs available, Suction available, Patient being monitored and Timeout performed Patient Re-evaluated:Patient Re-evaluated prior to inductionOxygen Delivery Method: Circle system utilized Preoxygenation: Pre-oxygenation with 100% oxygen Intubation Type: IV induction, Rapid sequence and Cricoid Pressure applied Laryngoscope Size: Mac and 4 Grade View: Grade I Tube type: Oral Tube size: 8.0 mm Number of attempts: 1 Airway Equipment and Method: Stylet Placement Confirmation: ETT inserted through vocal cords under direct vision Secured at: 24 cm Tube secured with: Tape Dental Injury: Teeth and Oropharynx as per pre-operative assessment

## 2012-04-01 NOTE — ED Provider Notes (Signed)
History     CSN: [id number]  Arrival date & time 04/01/12  0903   First MD Initiated Contact with Patient 04/01/12 [phone]      Chief Complaint  Patient presents with  . Fall    (Consider location/radiation/quality/duration/timing/severity/associated sxs/prior treatment) HPI Comments: [name] [name] is a 52 y.o. 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 Landed directly on both feet, and injured both knees. He was unable to walk, afterwards. He denies head, neck, or back injuries. He was at work doing his usual activities today, when he was injured. There is no radiation of the pain. The pain is sharp. The pain worsens with movement. He was treated seen by EMS, was splinted both legs and transferred him here. He was treated with IV fentanyl for pain. During transport with some improvement. He arrived to the ED with 10 out of 10 pain in both legs. There are no other modifying factors.  Patient is a 52 y.o. male presenting with fall. The history is provided by the patient.  Fall    History reviewed. No pertinent past medical history.  Past Surgical History  Procedure Laterality Date  . Ankle fracture surgery  2010    No family history on file.  History  Substance Use Topics  . Smoking status: Current Every Day Smoker    Types: Cigarettes  . Smokeless tobacco: Not on file  . Alcohol Use: Yes     Comment: daily      Review of Systems  All other systems reviewed and are negative.    Allergies  Review of patient's allergies indicates no known allergies.  Home Medications  No current outpatient prescriptions on file.  BP 161/124  Pulse 94  Temp(Src) 98.3 F (36.8 C) (Oral)  Resp 14  SpO2 97%  Physical Exam  Nursing note and vitals reviewed. Constitutional: He is oriented to person, place, and time. He appears well-developed and well-nourished.  HENT:  Head: Normocephalic and atraumatic.  Right Ear: External ear normal.  Left  Ear: External ear normal.  Eyes: Conjunctivae and EOM are normal. Pupils are equal, round, and reactive to light.  Neck: Normal range of motion and phonation normal. Neck supple.  Cardiovascular: Normal rate, regular rhythm, normal heart sounds and intact distal pulses.   Pulmonary/Chest: Effort normal and breath sounds normal. He exhibits no bony tenderness.  Abdominal: Soft. Normal appearance. There is no tenderness.  Musculoskeletal:  Tender swollen bilateral proximal tibia, with knee effusion, left greater than right. Left ankle tender, swollen, laterally. Intact pulses in both feet with normal movement and sensation in the toes, bilateral.  Neurological: He is alert and oriented to person, place, and time. He has normal strength. No cranial nerve deficit or sensory deficit. He exhibits normal muscle tone. Coordination normal.  Skin: Skin is warm, dry and intact.  Psychiatric: He has a normal mood and affect. His behavior is normal. Judgment and thought content normal.    ED Course  Procedures (including critical care time)  Cervical spine cleared clinically. Removed from backboard, without problem. No palpable tenderness of the thoracic, or lumbar spine.  ED treatment: Splints left in place, IV, Dilaudid, with redose.  Discussed with Dr. Jim Desanctis, he prefers Trauma Ortho. Eval./treat. Dr. Ave Filter will see the patient and admit him.    Date: 11/22/2011  Rate: 51  Rhythm: normal sinus rhythm  QRS Axis: normal  PR and QT Intervals: normal  ST/T Wave abnormalities: normal  PR and QRS Conduction Disutrbances:right bundle branch block  Narrative Interpretation:   Old EKG Reviewed: unchanged   Labs Reviewed  COMPREHENSIVE METABOLIC PANEL - Abnormal; Notable for the following:    Glucose, Bld 118 (*)    Total Bilirubin 0.2 (*)    All other components within normal limits  CBC - Abnormal; Notable for the following:    WBC 10.9 (*)    All other components within normal limits   CG4 I-STAT (LACTIC ACID) - Abnormal; Notable for the following:    Lactic Acid, Venous 3.28 (*)    All other components within normal limits  CBC  PROTIME-INR  URINALYSIS, MICROSCOPIC ONLY  SAMPLE TO BLOOD BANK   Dg Chest 1 View  04/01/2012  *RADIOLOGY REPORT*  Clinical Data: Fall.  CHEST - 1 VIEW  Comparison: None.  Findings: Punctate calcified granuloma on the left is again seen. Lungs otherwise clear.  Heart size is enlarged.  No pneumothorax or pleural effusion.  IMPRESSION: Cardiomegaly without acute disease.   Original Report Authenticated By: Holley Dexter, M.D.    Dg Femur Left  04/01/2012  *RADIOLOGY REPORT*  Clinical Data: Fall with left knee pain.  LEFT FEMUR - 2 VIEW  Comparison: None.  Findings: There is a vertically oriented fracture of the distal femoral metaphysis with slight lateral displacement of the lateral condyle fragment.  Associated joint effusion.  Femur is otherwise intact.  IMPRESSION: Distal femur fracture with extension into the knee joint and associated joint effusion.   Original Report Authenticated By: Leanna Battles, M.D.    Dg Tibia/fibula Left  04/01/2012  *RADIOLOGY REPORT*  Clinical Data: Fall, pain.  LEFT TIBIA AND FIBULA - 2 VIEW  Comparison: None.  Findings: The patient has a fracture of the distal femur.  The fracture is vertical in orientation extending from the lateral metadiaphysis through the intercondylar notch.  There is no notable displacement.  No other fracture about the knee is seen.  The patient has a comminuted fracture of the calcaneus involving the middle facet with depression.  Old healed ankle fractures with hardware in place are noted.  IMPRESSION:  1.  Nondisplaced distal femur fracture as described. 2.  Acute comminuted central depression type fracture of the calcaneus. 3.  Old healed medial and lateral malleolar fractures with hardware in place.   Original Report Authenticated By: Holley Dexter, M.D.    Dg Tibia/fibula  Right  04/01/2012  *RADIOLOGY REPORT*  Clinical Data: Fall, pain.  RIGHT TIBIA AND FIBULA - 2 VIEW  Comparison: None.  Findings: The patient has a highly comminuted and distracted fracture of the right tibial plateau.  The fracture is impacted. Associated lipohemarthrosis of the knee is identified.  No other fracture is seen.  IMPRESSION: Comminuted and distracted right tibial plateau fracture.   Original Report Authenticated By: Holley Dexter, M.D.    Dg Ankle Complete Left  04/01/2012  *RADIOLOGY REPORT*  Clinical Data: Fall with left knee pain and left ankle pain.  LEFT ANKLE COMPLETE - 3+ VIEW  Comparison: 06/18/2009.  Findings: Plate and screws traverse healed fractures of the medial and lateral malleoli.  Comminuted and mildly impacted calcaneal fracture is seen.  Bones appear somewhat demineralized.  IMPRESSION:  1.  Comminuted and depressed calcaneal fracture. 2.  Healed medial and lateral malleolar fractures.   Original Report Authenticated By: Leanna Battles, M.D.      1. Fracture of tibia, proximal, right, closed, initial encounter   2. Fracture, femur, distal, left, closed, initial encounter  MDM  Accidental fall, with bilateral extremity injuries. Patient's injuries, are unstable and he will need to be admitted to orthopedist, for management.       Flint Melter, MD 04/01/12 1700

## 2012-04-01 NOTE — Anesthesia Preprocedure Evaluation (Addendum)
Anesthesia Evaluation  Patient identified by MRN, date of birth, ID band Patient awake  General Assessment Comment:History of work injury noted.  Reviewed: Allergy & Precautions, H&P , NPO status , Patient's Chart, lab work & pertinent test results  Airway Mallampati: II      Dental   Pulmonary neg pulmonary ROS,  breath sounds clear to auscultation        Cardiovascular negative cardio ROS  Rhythm:Regular Rate:Normal     Neuro/Psych    GI/Hepatic negative GI ROS, Neg liver ROS,   Endo/Other  negative endocrine ROS  Renal/GU negative Renal ROS     Musculoskeletal   Abdominal   Peds  Hematology   Anesthesia Other Findings   Reproductive/Obstetrics                          Anesthesia Physical Anesthesia Plan  ASA: II  Anesthesia Plan: General   Post-op Pain Management:    Induction: Intravenous  Airway Management Planned: Oral ETT  Additional Equipment:   Intra-op Plan:   Post-operative Plan: Possible Post-op intubation/ventilation  Informed Consent: I have reviewed the patients History and Physical, chart, labs and discussed the procedure including the risks, benefits and alternatives for the proposed anesthesia with the patient or authorized representative who has indicated his/her understanding and acceptance.   Dental advisory given  Plan Discussed with: CRNA, Anesthesiologist and Surgeon  Anesthesia Plan Comments:         Anesthesia Quick Evaluation

## 2012-04-02 ENCOUNTER — Inpatient Hospital Stay (HOSPITAL_COMMUNITY): Payer: Worker's Compensation

## 2012-04-02 ENCOUNTER — Encounter (HOSPITAL_COMMUNITY): Payer: Self-pay | Admitting: Radiology

## 2012-04-02 LAB — COMPREHENSIVE METABOLIC PANEL
ALT: 19 U/L (ref 0–53)
Alkaline Phosphatase: 46 U/L (ref 39–117)
BUN: 11 mg/dL (ref 6–23)
CO2: 29 mEq/L (ref 19–32)
GFR calc Af Amer: >90 mL/min (ref >90)
GFR calc non Af Amer: >90 mL/min (ref >90)
Glucose, Bld: 117 mg/dL — ABNORMAL HIGH (ref 70–99)
Potassium: 4.3 mEq/L (ref 3.5–5.1)
Sodium: 137 mEq/L (ref 135–145)
Total Bilirubin: 0.4 mg/dL (ref 0.3–1.2)
Total Protein: 5.4 g/dL — ABNORMAL LOW (ref 6.0–8.3)

## 2012-04-02 LAB — URINALYSIS, MICROSCOPIC ONLY
Bilirubin Urine: NEGATIVE
Glucose, UA: NEGATIVE mg/dL
Ketones, ur: NEGATIVE mg/dL
Leukocytes, UA: NEGATIVE
Nitrite: NEGATIVE
Protein, ur: NEGATIVE mg/dL
Urobilinogen, UA: 0.2 mg/dL (ref 0.0–1.0)
pH: 5 (ref 5.0–8.0)

## 2012-04-02 LAB — CBC
HCT: 33.2 % — ABNORMAL LOW (ref 39.0–52.0)
MCHC: 33.1 g/dL (ref 30.0–36.0)
Platelets: 220 10*3/uL (ref 150–400)
RDW: 13.7 % (ref 11.5–15.5)
WBC: 7.4 10*3/uL (ref 4.0–10.5)

## 2012-04-02 LAB — PHOSPHORUS: Phosphorus: 3 mg/dL (ref 2.3–4.6)

## 2012-04-02 LAB — MAGNESIUM: Magnesium: 2 mg/dL (ref 1.5–2.5)

## 2012-04-02 MED ORDER — HYDROMORPHONE 0.3 MG/ML IV SOLN
INTRAVENOUS | Status: DC
  Administered 2012-04-02: 1.19 mg via INTRAVENOUS
  Administered 2012-04-02: 14:00:00 via INTRAVENOUS
  Administered 2012-04-02: 1.59 mg via INTRAVENOUS
  Administered 2012-04-03: 2.59 mg via INTRAVENOUS
  Administered 2012-04-03: 2.39 mg via INTRAVENOUS
  Administered 2012-04-03: 0.799 mg via INTRAVENOUS
  Administered 2012-04-03: 2.99 mg via INTRAVENOUS
  Filled 2012-04-02 (×2): qty 25

## 2012-04-02 MED ORDER — SODIUM CHLORIDE 0.9 % IJ SOLN: 9.0000 mL | INTRAMUSCULAR | Status: DC | PRN

## 2012-04-02 MED ORDER — OXYCODONE HCL 5 MG PO TABS
5.0000 mg | ORAL_TABLET | ORAL | Status: DC | PRN
  Administered 2012-04-03: 15 mg via ORAL
  Administered 2012-04-03 – 2012-04-05 (×4): 10 mg via ORAL
  Administered 2012-04-05: 15 mg via ORAL
  Administered 2012-04-05 – 2012-04-06 (×2): 10 mg via ORAL
  Administered 2012-04-07: 15 mg via ORAL
  Filled 2012-04-02 (×3): qty 2
  Filled 2012-04-02: qty 3
  Filled 2012-04-02 (×2): qty 2
  Filled 2012-04-02 (×2): qty 3
  Filled 2012-04-02: qty 2

## 2012-04-02 MED ORDER — ONDANSETRON HCL 4 MG/2ML IJ SOLN: 4.0000 mg | Freq: Four times a day (QID) | INTRAMUSCULAR | Status: DC | PRN

## 2012-04-02 MED ORDER — ENOXAPARIN SODIUM 40 MG/0.4ML ~~LOC~~ SOLN
40.0000 mg | SUBCUTANEOUS | Status: DC
  Administered 2012-04-03 – 2012-04-05 (×3): 40 mg via SUBCUTANEOUS
  Filled 2012-04-02 (×5): qty 0.4

## 2012-04-02 MED ORDER — DIPHENHYDRAMINE HCL 50 MG/ML IJ SOLN: 12.5000 mg | Freq: Four times a day (QID) | INTRAMUSCULAR | Status: DC | PRN

## 2012-04-02 MED ORDER — NALOXONE HCL 0.4 MG/ML IJ SOLN: 0.4000 mg | INTRAMUSCULAR | Status: DC | PRN

## 2012-04-02 MED ORDER — DIPHENHYDRAMINE HCL 12.5 MG/5ML PO ELIX: 12.5000 mg | ORAL_SOLUTION | Freq: Four times a day (QID) | ORAL | Status: DC | PRN

## 2012-04-02 MED ORDER — ACETAMINOPHEN 10 MG/ML IV SOLN
1000.0000 mg | Freq: Four times a day (QID) | INTRAVENOUS | Status: DC
  Administered 2012-04-02 – 2012-04-03 (×3): 1000 mg via INTRAVENOUS
  Filled 2012-04-02 (×4): qty 100

## 2012-04-02 MED ORDER — ALPRAZOLAM 0.25 MG PO TABS
0.2500 mg | ORAL_TABLET | Freq: Three times a day (TID) | ORAL | Status: DC | PRN
  Administered 2012-04-04 – 2012-04-07 (×5): 0.25 mg via ORAL
  Filled 2012-04-02 (×5): qty 1

## 2012-04-02 MED ORDER — METHOCARBAMOL 500 MG PO TABS
1000.0000 mg | ORAL_TABLET | Freq: Four times a day (QID) | ORAL | Status: DC
  Administered 2012-04-02 – 2012-04-15 (×31): 1000 mg via ORAL
  Filled 2012-04-02 (×59): qty 2

## 2012-04-02 NOTE — Progress Notes (Signed)
UR COMPLETED  

## 2012-04-02 NOTE — Progress Notes (Signed)
CARE MANAGEMENT NOTE 04/02/2012  Patient:  [name] [name], [name] [name]   Account Number:  [id number]  Date Initiated:  04/02/2012  Documentation initiated by:  Vance Peper  Subjective/Objective Assessment:   52 yr old male admitted for right tib/fib fracture, had placement of external fixator. Left calcaneous fracture.Patient will have surgery possibly this evening.               Status of service:  In process, will continue to follow  Comments:  04/02/12 12:27 Vance Peper, RN BSN Case Manager Patient is under worker's comp.  RN Case Manager is Blima Dessert, [phone] cell and Fax. For home health therapy call [facility] [phone], for DME and transportation  use [employer]- [phone]

## 2012-04-02 NOTE — Progress Notes (Signed)
PATIENT ID: [name] [name]   1 Day Post-Op Procedure(s) (LRB): TIBIAL PLATEAU/External Fixation (Right)  Subjective: The patient is resting comfortably. His pain is currently well controlled. He just had his Percocet. There were times overnight when he had increased pain but it has been well-controlled with his current regimen. The pain is worse in the left lower extremity than the right.  Objective:  Filed Vitals:   04/02/12 0526  BP: 127/62  Pulse: 78  Temp: 98 F (36.7 C)  Resp: 16     Examination of the right lower extremity shows small fracture blister anteriorly. Pin sites have some small bloody drainage. Compartments are full but compressible. Distally he can wiggle his toes and ankle and is grossly neurovascularly intact. Left lower extremity shows splint and knee immobilizer to be intact. He can wiggle his toes. The toes are warm and well-perfused.  Labs:   Recent Labs  04/01/12 0940 04/01/12 1245 04/01/12 2016  HGB 16.2 14.3 12.7*   Recent Labs  04/01/12 1245 04/01/12 2016  WBC 10.9* 9.2  RBC 4.38 3.89*  HCT 41.5 37.2*  PLT 275 240   Recent Labs  04/01/12 0940 04/01/12 2016  NA 141  --   K 4.7  --   CL 103  --   CO2 24  --   BUN 12  --   CREATININE 0.68 0.90  GLUCOSE 118*  --   CALCIUM 10.0  --     Assessment and Plan: Comminuted bicondylar right tibial plateau fracture, left distal femur fracture, left calcaneus fracture Dr. Carola Frost will evaluate the patient's morning and will take over the orthopedic trauma needs of this patient moving forward. For now he will remain n.p.o. in anticipation of possible surgery today. Surgery on the right lower extremity will likely need to be delayed secondary to swelling and fracture blisters.  VTE proph: Lovenox

## 2012-04-02 NOTE — Progress Notes (Signed)
Orthopedic Tech Progress Note Patient Details:  [name] [name] 22-Jun-1960 [id number]  Patient ID: [name] [name], male   DOB: 1960-02-07, 52 y.o.   MRN: [id number]   [name] [name] 04/02/2012, 3:15 PM   [name] [name] [name] [name]

## 2012-04-02 NOTE — Consult Note (Signed)
Orthopaedic Trauma Service Consult  Reason for Consult: multiple LEx fractures Referring Physician: Geni Bers, MD (Ortho)   HPI:   The patient is a 52 year old Caucasian male who works as a [employer] fast streamer. 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 Patient states that he is about 5 feet in the air when he fell on the concrete sidewalk below. Patient states that the load he was caring shifted which caused him to fall off the back of the left. The patient had immediate onset of pain and inability to bear weight. He was brought to [place] where he was found to have multiple lower extremity fractures including a R bicondylar tibial plateau fracture, left lateral femoral condyle fracture and a left calcaneus fracture. Given the significant comminution and shortening of his right tibial plateau fracture she was taken urgently to the operating room for spanning external fixation by Dr. Ave Filter. There initially was some concern for possible evolving compartment syndrome however fasciotomies were not performed as patient did not have clinical compartment syndrome. Patient was admitted to the hospital and orthopedic trauma service was consult and for definitive management. Given the constellation of injuries as well as the complexity of the injury it was felt that the patient needed the services of the orthopedic trauma service and a fellowship trained orthopedic traumatologist.  Patient is currently in 5 N. 28 he does appear to be somewhat uncomfortable given his lower extremity injuries. He denies any injuries to his upper extremities. He does report a history of a left ankle fracture several years ago again from a lower energy mechanism when he slipped on some grass. Patient denies any numbness or tingling in his extremities. Does have a little discomfort in his low back due to overlying in bed for the last several hours. He denies any chest  pain or shortness of breath. No palpitations. No headache or lightheadedness. No bowel pain or nausea/vomiting. Last bowel movement was yesterday before he went to work. He is passing gas.  The patient lives at home and came to town with his wife single-story house. No assistive devices prior to admission. He does have a children who are grown and live out of the house.  PCP is Dr. Everlene Other  History reviewed. No pertinent past medical history.  Past Surgical History  Procedure Laterality Date  . Ankle fracture surgery  2010    No family history on file.  Social History:  reports that he has been smoking Cigarettes.  He has been smoking about 1.50 packs per day. He does not have any smokeless tobacco history on file. He reports that  drinks alcohol. He reports that he does not use illicit drugs.  The patient is a active smoker. He smokes approximately a pack and half per day. He has considered quitting and does have an e-cigarette.  Allergies: No Known Allergies  Medications: I have reviewed the patient's current medications.  Results for orders placed during the hospital encounter of 04/01/12 (from the past 48 hour(s))  SAMPLE TO BLOOD BANK     Status: None   Collection Time    04/01/12  9:15 AM      Result Value Range   Blood Bank Specimen SAMPLE AVAILABLE FOR TESTING     Sample Expiration 04/02/2012    COMPREHENSIVE METABOLIC PANEL     Status: Abnormal   Collection Time    04/01/12  9:40 AM      Result  Value Range   Sodium 141  135 - 145 mEq/L   Potassium 4.7  3.5 - 5.1 mEq/L   Chloride 103  96 - 112 mEq/L   CO2 24  19 - 32 mEq/L   Glucose, Bld 118 (*) 70 - 99 mg/dL   BUN 12  6 - 23 mg/dL   Creatinine, Ser 8.11  0.50 - 1.35 mg/dL   Calcium 91.4  8.4 - 78.2 mg/dL   Total Protein 7.1  6.0 - 8.3 g/dL   Albumin 3.8  3.5 - 5.2 g/dL   AST 26  0 - 37 U/L   ALT 28  0 - 53 U/L   Alkaline Phosphatase 57  39 - 117 U/L   Total Bilirubin 0.2 (*) 0.3 - 1.2 mg/dL   GFR calc non Af  Amer >90  >90 mL/min   GFR calc Af Amer >90  >90 mL/min   Comment:            The eGFR has been calculated     using the CKD EPI equation.     This calculation has not been     validated in all clinical     situations.     eGFR's persistently     <90 mL/min signify     possible Chronic Kidney Disease.  CBC     Status: None   Collection Time    04/01/12  9:40 AM      Result Value Range   WBC 8.2  4.0 - 10.5 K/uL   RBC 5.05  4.22 - 5.81 MIL/uL   Hemoglobin 16.2  13.0 - 17.0 g/dL   HCT 95.6  21.3 - 08.6 %   MCV 94.7  78.0 - 100.0 fL   MCH 32.1  26.0 - 34.0 pg   MCHC 33.9  30.0 - 36.0 g/dL   RDW 57.8  46.9 - 62.9 %   Platelets 285  150 - 400 K/uL  PROTIME-INR     Status: None   Collection Time    04/01/12  9:40 AM      Result Value Range   Prothrombin Time 12.1  11.6 - 15.2 seconds   INR 0.90  0.00 - 1.49  CG4 I-STAT (LACTIC ACID)     Status: Abnormal   Collection Time    04/01/12 10:16 AM      Result Value Range   Lactic Acid, Venous 3.28 (*) 0.5 - 2.2 mmol/L  CBC     Status: Abnormal   Collection Time    04/01/12 12:45 PM      Result Value Range   WBC 10.9 (*) 4.0 - 10.5 K/uL   RBC 4.38  4.22 - 5.81 MIL/uL   Hemoglobin 14.3  13.0 - 17.0 g/dL   HCT 52.8  41.3 - 24.4 %   MCV 94.7  78.0 - 100.0 fL   MCH 32.6  26.0 - 34.0 pg   MCHC 34.5  30.0 - 36.0 g/dL   RDW 01.0  27.2 - 53.6 %   Platelets 275  150 - 400 K/uL  CBC     Status: Abnormal   Collection Time    04/01/12  8:16 PM      Result Value Range   WBC 9.2  4.0 - 10.5 K/uL   RBC 3.89 (*) 4.22 - 5.81 MIL/uL   Hemoglobin 12.7 (*) 13.0 - 17.0 g/dL   HCT 64.4 (*) 03.4 - 74.2 %   MCV 95.6  78.0 - 100.0 fL  MCH 32.6  26.0 - 34.0 pg   MCHC 34.1  30.0 - 36.0 g/dL   RDW 57.8  46.9 - 62.9 %   Platelets 240  150 - 400 K/uL  CREATININE, SERUM     Status: None   Collection Time    04/01/12  8:16 PM      Result Value Range   Creatinine, Ser 0.90  0.50 - 1.35 mg/dL   GFR calc non Af Amer >90  >90 mL/min   GFR calc Af  Amer >90  >90 mL/min   Comment:            The eGFR has been calculated     using the CKD EPI equation.     This calculation has not been     validated in all clinical     situations.     eGFR's persistently     <90 mL/min signify     possible Chronic Kidney Disease.  URINALYSIS, MICROSCOPIC ONLY     Status: Abnormal   Collection Time    04/02/12  2:05 AM      Result Value Range   Color, Urine YELLOW  YELLOW   APPearance CLEAR  CLEAR   Specific Gravity, Urine 1.016  1.005 - 1.030   pH 5.0  5.0 - 8.0   Glucose, UA NEGATIVE  NEGATIVE mg/dL   Hgb urine dipstick SMALL (*) NEGATIVE   Bilirubin Urine NEGATIVE  NEGATIVE   Ketones, ur NEGATIVE  NEGATIVE mg/dL   Protein, ur NEGATIVE  NEGATIVE mg/dL   Urobilinogen, UA 0.2  0.0 - 1.0 mg/dL   Nitrite NEGATIVE  NEGATIVE   Leukocytes, UA NEGATIVE  NEGATIVE   WBC, UA 0-2  <3 WBC/hpf   RBC / HPF 3-6  <3 RBC/hpf   Bacteria, UA RARE  RARE   Squamous Epithelial / LPF RARE  RARE   Casts HYALINE CASTS (*) NEGATIVE  COMPREHENSIVE METABOLIC PANEL     Status: Abnormal   Collection Time    04/02/12  5:52 AM      Result Value Range   Sodium 137  135 - 145 mEq/L   Potassium 4.3  3.5 - 5.1 mEq/L   Chloride 100  96 - 112 mEq/L   CO2 29  19 - 32 mEq/L   Glucose, Bld 117 (*) 70 - 99 mg/dL   BUN 11  6 - 23 mg/dL   Creatinine, Ser 5.28  0.50 - 1.35 mg/dL   Calcium 8.5  8.4 - 41.3 mg/dL   Total Protein 5.4 (*) 6.0 - 8.3 g/dL   Albumin 2.8 (*) 3.5 - 5.2 g/dL   AST 21  0 - 37 U/L   ALT 19  0 - 53 U/L   Alkaline Phosphatase 46  39 - 117 U/L   Total Bilirubin 0.4  0.3 - 1.2 mg/dL   GFR calc non Af Amer >90  >90 mL/min   GFR calc Af Amer >90  >90 mL/min   Comment:            The eGFR has been calculated     using the CKD EPI equation.     This calculation has not been     validated in all clinical     situations.     eGFR's persistently     <90 mL/min signify     possible Chronic Kidney Disease.  CBC     Status: Abnormal   Collection Time     04/02/12  5:52 AM  Result Value Range   WBC 7.4  4.0 - 10.5 K/uL   RBC 3.43 (*) 4.22 - 5.81 MIL/uL   Hemoglobin 11.0 (*) 13.0 - 17.0 g/dL   HCT 16.1 (*) 09.6 - 04.5 %   MCV 96.8  78.0 - 100.0 fL   MCH 32.1  26.0 - 34.0 pg   MCHC 33.1  30.0 - 36.0 g/dL   RDW 40.9  81.1 - 91.4 %   Platelets 220  150 - 400 K/uL    Dg Chest 1 View  04/01/2012  *RADIOLOGY REPORT*  Clinical Data: Fall.  CHEST - 1 VIEW  Comparison: None.  Findings: Punctate calcified granuloma on the left is again seen. Lungs otherwise clear.  Heart size is enlarged.  No pneumothorax or pleural effusion.  IMPRESSION: Cardiomegaly without acute disease.   Original Report Authenticated By: Holley Dexter, M.D.    Dg Femur Left  04/01/2012  *RADIOLOGY REPORT*  Clinical Data: Fall with left knee pain.  LEFT FEMUR - 2 VIEW  Comparison: None.  Findings: There is a vertically oriented fracture of the distal femoral metaphysis with slight lateral displacement of the lateral condyle fragment.  Associated joint effusion.  Femur is otherwise intact.  IMPRESSION: Distal femur fracture with extension into the knee joint and associated joint effusion.   Original Report Authenticated By: Leanna Battles, M.D.    Dg Knee 1-2 Views Right  04/01/2012  *RADIOLOGY REPORT*  Clinical Data: RIGHT KNEE EXTERNAL FIXATION.  DG C-ARM 1-60 MIN,RIGHT KNEE - 1-2 VIEW  Comparison: 04/01/2012  Findings: Two intraoperative spot images demonstrate a comminuted proximal right tibial fracture.  Mild displacement fracture fragments, improved since prior study.  IMPRESSION: Comminuted right proximal tibial fracture with improved alignment but continued mild displacement fracture fragments.   Original Report Authenticated By: Charlett Nose, M.D.    Dg Tibia/fibula Left  04/01/2012  *RADIOLOGY REPORT*  Clinical Data: Fall, pain.  LEFT TIBIA AND FIBULA - 2 VIEW  Comparison: None.  Findings: The patient has a fracture of the distal femur.  The fracture is vertical in  orientation extending from the lateral metadiaphysis through the intercondylar notch.  There is no notable displacement.  No other fracture about the knee is seen.  The patient has a comminuted fracture of the calcaneus involving the middle facet with depression.  Old healed ankle fractures with hardware in place are noted.  IMPRESSION:  1.  Nondisplaced distal femur fracture as described. 2.  Acute comminuted central depression type fracture of the calcaneus. 3.  Old healed medial and lateral malleolar fractures with hardware in place.   Original Report Authenticated By: Holley Dexter, M.D.    Dg Tibia/fibula Right  04/01/2012  *RADIOLOGY REPORT*  Clinical Data: Fall, pain.  RIGHT TIBIA AND FIBULA - 2 VIEW  Comparison: None.  Findings: The patient has a highly comminuted and distracted fracture of the right tibial plateau.  The fracture is impacted. Associated lipohemarthrosis of the knee is identified.  No other fracture is seen.  IMPRESSION: Comminuted and distracted right tibial plateau fracture.   Original Report Authenticated By: Holley Dexter, M.D.    Dg Ankle Complete Left  04/01/2012  *RADIOLOGY REPORT*  Clinical Data: Fall with left knee pain and left ankle pain.  LEFT ANKLE COMPLETE - 3+ VIEW  Comparison: 06/18/2009.  Findings: Plate and screws traverse healed fractures of the medial and lateral malleoli.  Comminuted and mildly impacted calcaneal fracture is seen.  Bones appear somewhat demineralized.  IMPRESSION:  1.  Comminuted and depressed  calcaneal fracture. 2.  Healed medial and lateral malleolar fractures.   Original Report Authenticated By: Leanna Battles, M.D.    Ct Knee Left Wo Contrast  04/02/2012  *RADIOLOGY REPORT*  Clinical Data: Bilateral knee and left calcaneal fractures after fall.  CT OF THE LEFT KNEE WITHOUT CONTRAST  Technique:  Multidetector CT imaging was performed according to the standard protocol. Multiplanar CT image reconstructions were also generated.   Comparison: Left knee 04/01/2012  Findings: There is a mostly oblique fracture of the right lateral femoral condyle extending into the lateral metaphysis.  Fracture lines extends to the medial aspect of the lateral femoral condyle at the junction of the intercondylar notch.  There is mild impaction and lateral displacement of the lateral condylar fracture fragment.  There is a nondisplaced fracture of the left proximal fibula.  The tibia appears intact.  Left knee effusion consistent with hemarthrosis.  The patella appears intact.  Mild soft tissue edema or hematoma in the subcutaneous fat around the lateral aspect of the right knee.  The underlying bone trabecular architecture demonstrates is somewhat permeative lucent pattern suggesting underlying bone demineralization or infiltrative process.  Mild degenerative changes in the left knee with narrowed medial compartment.  IMPRESSION: Mildly displaced fracture of the left lateral femoral condyle. Nondisplaced fracture of the proximal fibular head.  Underlying bone demineralization versus permeative infiltrative process.   Original Report Authenticated By: Burman Nieves, M.D.    Ct Knee Right Wo Contrast  04/02/2012  *RADIOLOGY REPORT*  Clinical Data: Right knee fractures after fall.  Status post external fixation.  CT OF THE RIGHT KNEE WITHOUT CONTRAST  Technique:  Multidetector CT imaging was performed according to the standard protocol. Multiplanar CT image reconstructions were also generated.  Comparison: Right knee 04/01/2012  Findings: Markedly comminuted fractures involving the proximal tibia, with involvement of the metaphysis and extending into the medial and lateral tibial plateaus and tibial spines.  There is impaction and burst displacement of fracture fragments. There is anteroposterior distraction of proximal tibial fragments in the medial compartment of about 1.2 cm.  Multiple areas of cortical fragment subsidence in the medial and lateral tibial  plateaus. Fragmentation of the tibial spines.  Fracture lines extends to the tibiofibular joint.  The patella and proximal fibula and distal femur appear intact. Right knee effusion consistent with hemarthrosis.  Diffuse soft tissue swelling involving the right knee circumferentially due to edema or hematoma.  IMPRESSION: Markedly comminuted fractures of the proximal right tibia involving the medial and lateral tibial plateaus as well as the metaphysis. Hemarthrosis and diffuse soft tissue swelling.   Original Report Authenticated By: Burman Nieves, M.D.    Ct Foot Left Wo Contrast  04/02/2012  *RADIOLOGY REPORT*  Clinical Data: Left calcaneal fracture after fall.  CT OF THE LEFT FOOT WITHOUT CONTRAST  Technique:  Multidetector CT imaging was performed according to the standard protocol. Multiplanar CT image reconstructions were also generated.  Comparison: Left calcaneus 04/01/2012  Findings: Postoperative changes with plate and screw fixation as well as screw fixations of old fractures of the left medial and lateral malleoli.  No residual fracture lines are demonstrated. Splint material is present.  There are acute comminuted fractures of the calcaneus with fracture lines extending to the subtalar joints as well as the calcaneal cuboidal and calcaneal navicular joints.  There is impaction of fracture fragments with mild distraction of the coronal fracture through the body of the calcaneus.  There is a small minimally displaced fracture of the posterior process of  the talus. Nondisplaced fracture of the proximal medial aspect of the navicular bone.  Visualized tarsal and metatarsal bones are otherwise intact.  There is an old ununited ossicle adjacent to the cuboidal bone.  Diffuse low attenuation change and trabecular pattern with permeative lucencies in the visualized bones suggesting underlying osteopenia, osteomalacia, or infiltrative process.  Diffuse soft tissue swelling or hematoma in the subcutaneous  fat.  IMPRESSION: Acute comminuted fractures of the calcaneus with displacement. Nondisplaced fractures of the posterior process of the talus and of the proximal medial aspect of the navicular bone.   Original Report Authenticated By: Burman Nieves, M.D.    Dg C-arm 1-60 Min  04/01/2012  *RADIOLOGY REPORT*  Clinical Data: RIGHT KNEE EXTERNAL FIXATION.  DG C-ARM 1-60 MIN,RIGHT KNEE - 1-2 VIEW  Comparison: 04/01/2012  Findings: Two intraoperative spot images demonstrate a comminuted proximal right tibial fracture.  Mild displacement fracture fragments, improved since prior study.  IMPRESSION: Comminuted right proximal tibial fracture with improved alignment but continued mild displacement fracture fragments.   Original Report Authenticated By: Charlett Nose, M.D.     Review of Systems  Constitutional: Negative for fever and chills.  Eyes: Negative for blurred vision.  Respiratory: Negative for cough, shortness of breath and wheezing.   Cardiovascular: Negative for chest pain and palpitations.  Gastrointestinal: Negative for heartburn, nausea, vomiting and abdominal pain.       + flatus  Genitourinary: Negative for dysuria.  Musculoskeletal:       R and L knee pain, L foot pain  Neurological: Negative for tingling, sensory change, focal weakness and headaches.   Blood pressure 136/71, pulse 89, temperature 98.9 F (37.2 C), temperature source Oral, resp. rate 20, SpO2 96.00%. Physical Exam  Constitutional: He is oriented to person, place, and time. He appears well-developed and well-nourished. He is cooperative.  Appears uncomfortable   HENT:  Head: Normocephalic and atraumatic.  Mouth/Throat: Oropharynx is clear and moist and mucous membranes are normal.  Eyes: EOM are normal. Pupils are equal, round, and reactive to light.  Neck: Full passive range of motion without pain. Neck supple. No spinous process tenderness and no muscular tenderness present. Normal range of motion present.   Cardiovascular:  Distant, S1 and S2, RRR  Respiratory:  Clear B   GI:  Obese, soft, NT, + BS  Musculoskeletal:  Bilateral upper extremities   No acute findings   Motor and sensory functions are intact   Palpable pulses appreciated   No pain with palpation  Pelvis   No instability or pain with AP or lateral compression  Right lower extremity   Hip is unremarkable   Nontender with evaluation. No pain with axial loading or logrolling of his hip   Spanning external fixator noted to the right leg   2 pins in the femur and 2 pins in the tibia connected by 2 straight carbon fiber rods. Zimmer external fixator   Significant swelling is noted to the lower leg on the right side. There are several fracture blisters noted medially. Skin does not wrinkles with gentle compression  Foot is swollen as well  Compartments are full but nontender and compressible  No pain out of proportion with passive stretch of his toes or ankle   Extremity is warm   Deep peroneal nerve, superficial peroneal nerve and tibial nerve sensory functions are intact   EHL, FHL, anterior tibialis, posterior tibialis, peroneals and gastrocsoleus complex motor function are intact.  Left lower extremity   Hip is unremarkable. No pain  with axial loading or logrolling   Knee immobilizer to the left knee and posterior short-leg splint to left foot and ankle   There is minimal swelling about the left knee. Tenderness to the lateral aspect of the left distal femur. Knee stability not evaluated secondary to fracture of the lateral femoral condyle   Skin wrinkles laterally with gentle compression   Left ankle and foot splint is fitting well.   EHL and FHL motor function are intact   Deep peroneal nerve, superficial peroneal nerve and tibial nerve sensory functions are intact   Extremity is warm brisk capillary refill   No pain with passive stretching of the toes       Neurological: He is alert and oriented to person,  place, and time.  Psychiatric: He has a normal mood and affect. His speech is normal and behavior is normal. Thought content normal. Cognition and memory are normal.    Assessment/Plan:  52 year old male status post fall off of a truck with multiple lower extremity fractures  1. comminuted right bicondylar tibial plateau fracture, Schatzker 5, OTA 41-C3     Left lateral femoral condyle fracture, OTA 33-B1     Comminuted L calcaneus fx, sanders 3, OTA 82-C3   The patient will need further surgical stabilization of all of these fractures. As already been temporized with an external fixator to the right leg. Patient has extensive swelling and fracture blistering to the right leg as well and the patient will require at least one week of soft tissue as before we can proceed with definitive fixation.  We plan on proceeding to the OR on Tuesday of next week for fixation of the distal femur on the left leg. At that time we will take down the splint on the right foot and evaluate his soft tissue to ascertain a better time frame for fixation of his left calcaneus however given his story nature of these injuries I would anticipate at least 2 weeks before definitive fixation can occur   Patient will be nonweightbearing on his bilateral lower extremities for 8 weeks after definitive fixation of all of his fractures  He will be bed to chair transfers.  He will however have unrestricted range of motion of his knees bilaterally after definitive fixation. And we will begin range of motion of his left ankle and subtalar joint a 2-3 weeks after that surgery.  I did change the dressings today to his ex fix and applied an Ace wrap and Kerlix foot up to his thigh as well as to the pin sites. We will continue with aggressive icing and elevation to help control swelling.  Ice to the left knee is also encouraged  I will have the occupational therapist fabricate a foot plate 40 right leg to maintain neutral position of  his ankle while patient remains in his ex fix  2. Pain  Change patient to a PCA  IV Tylenol for the next 24 hours  Robaxin for muscle spasms  3. nicotine dependence  Patient wants to quit smoking of this time however we did discuss in great detail the dose dependent relationship with nicotine in terms of fracture healing. I would like for him to avoid nicotine patches as it is a constant dose of nicotine. He does have access to an E. cigarette which may be advantageous to him to decrease his nicotine dose sequentially  If quitting cold [place] is too difficult  4. DVT and PE prophylaxis  Lovenox 40 mg subcutaneous daily  Foot pump to the right foot  5. FEN  Diet as tolerated  6. Miscellaneous  given his constellation of fractures as well as his previous low energy fracture I am a little concerned about poor bone density in this particular individual. He is rather young to sustained fractures such as these  I will check a vitamin D panel while he is here and based on findings we will direct/additional laboratory evaluation. My current thinking is potentially for testosterone testing if his vitamin D does in fact come back low.  The patient does not have any chronic diseases or take any chronic medications other than nicotine use which may cause low bone density and affect bone metabolism I therefore am concerned with a secondary cause of his potentially low bone density.  7. Disposition  Continue with aggressive soft tissue care consisting of elevation, ice and gentle compressive wraps  Patient can be bed to chair transfers with therapy  Plan for OR Monday afternoon, patient has already been posted  Mearl Latin, PA-C [facility] [phone] (P) 04/02/2012, 2:11 PM

## 2012-04-03 ENCOUNTER — Encounter (HOSPITAL_COMMUNITY): Payer: Self-pay | Admitting: Orthopedic Surgery

## 2012-04-03 DIAGNOSIS — E669 Obesity, unspecified: Secondary | ICD-10-CM | POA: Diagnosis present

## 2012-04-03 DIAGNOSIS — S82143A Displaced bicondylar fracture of unspecified tibia, initial encounter for closed fracture: Secondary | ICD-10-CM

## 2012-04-03 DIAGNOSIS — F17200 Nicotine dependence, unspecified, uncomplicated: Secondary | ICD-10-CM

## 2012-04-03 DIAGNOSIS — E559 Vitamin D deficiency, unspecified: Secondary | ICD-10-CM

## 2012-04-03 DIAGNOSIS — S72402A Unspecified fracture of lower end of left femur, initial encounter for closed fracture: Secondary | ICD-10-CM

## 2012-04-03 DIAGNOSIS — S92002A Unspecified fracture of left calcaneus, initial encounter for closed fracture: Secondary | ICD-10-CM

## 2012-04-03 DIAGNOSIS — W19XXXA Unspecified fall, initial encounter: Secondary | ICD-10-CM

## 2012-04-03 HISTORY — DX: Vitamin D deficiency, unspecified: E55.9

## 2012-04-03 HISTORY — DX: Obesity, unspecified: E66.9

## 2012-04-03 HISTORY — DX: Nicotine dependence, unspecified, uncomplicated: F17.200

## 2012-04-03 LAB — COMPREHENSIVE METABOLIC PANEL
AST: 23 U/L (ref 0–37)
CO2: 30 mEq/L (ref 19–32)
Calcium: 8.7 mg/dL (ref 8.4–10.5)
Creatinine, Ser: 0.64 mg/dL (ref 0.50–1.35)
GFR calc non Af Amer: >90 mL/min (ref >90)
Sodium: 137 mEq/L (ref 135–145)
Total Protein: 5.9 g/dL — ABNORMAL LOW (ref 6.0–8.3)

## 2012-04-03 LAB — CBC
HCT: 31.4 % — ABNORMAL LOW (ref 39.0–52.0)
Hemoglobin: 10.4 g/dL — ABNORMAL LOW (ref 13.0–17.0)
MCH: 32.4 pg (ref 26.0–34.0)
RBC: 3.21 MIL/uL — ABNORMAL LOW (ref 4.22–5.81)

## 2012-04-03 LAB — PREALBUMIN: Prealbumin: 20.2 mg/dL (ref 17.0–34.0)

## 2012-04-03 MED ORDER — OXYCODONE HCL 5 MG PO TABS
5.0000 mg | ORAL_TABLET | Freq: Four times a day (QID) | ORAL | Status: DC | PRN
  Administered 2012-04-04: 5 mg via ORAL
  Filled 2012-04-03 (×2): qty 2

## 2012-04-03 MED ORDER — ACETAMINOPHEN 325 MG PO TABS: 325.0000 mg | ORAL_TABLET | Freq: Four times a day (QID) | ORAL | Status: DC | PRN

## 2012-04-03 MED ORDER — HYDROMORPHONE HCL PF 1 MG/ML IJ SOLN
0.5000 mg | INTRAMUSCULAR | Status: DC | PRN
  Administered 2012-04-04 – 2012-04-09 (×5): 1 mg via INTRAVENOUS
  Filled 2012-04-03 (×5): qty 1

## 2012-04-03 MED ORDER — VITAMIN D (ERGOCALCIFEROL) 1.25 MG (50000 UNIT) PO CAPS
50000.0000 [IU] | ORAL_CAPSULE | ORAL | Status: DC
  Administered 2012-04-03 – 2012-04-10 (×2): 50000 [IU] via ORAL
  Filled 2012-04-03 (×3): qty 1

## 2012-04-03 MED ORDER — OXYCODONE-ACETAMINOPHEN 5-325 MG PO TABS
1.0000 | ORAL_TABLET | Freq: Four times a day (QID) | ORAL | Status: DC | PRN
  Administered 2012-04-03: 1 via ORAL
  Administered 2012-04-03 – 2012-04-06 (×7): 2 via ORAL
  Filled 2012-04-03: qty 2
  Filled 2012-04-03: qty 1
  Filled 2012-04-03 (×9): qty 2

## 2012-04-03 MED ORDER — ADULT MULTIVITAMIN W/MINERALS CH
1.0000 | ORAL_TABLET | Freq: Every day | ORAL | Status: DC
  Administered 2012-04-03 – 2012-04-12 (×9): 1 via ORAL
  Filled 2012-04-03 (×14): qty 1

## 2012-04-03 NOTE — Evaluation (Signed)
Physical Therapy Evaluation Patient Details Name: [name] [name] MRN: [id number] DOB: [birth year] Today's Date: 04/03/2012 Time: 1601-     PT Assessment / Plan / Recommendation Clinical Impression  Pt did well with inital ant/post transfer to chair with bilater NWB  He will benefit from Pt for exercise for UE and mobility.  Recommend nursing continue to use ant/post technique for transfers    PT Assessment  Patient needs continued PT services    Follow Up Recommendations  Home health PT    Does the patient have the potential to tolerate intense rehabilitation      Barriers to Discharge        Equipment Recommendations  Wheelchair (measurements PT);Wheelchair cushion (measurements PT) (follow up and equipment to be determined after OR next week)    Recommendations for Other Services     Frequency Min 6X/week    Precautions / Restrictions Precautions Precautions: Other (comment) (ex fix Lt. LE) Restrictions Weight Bearing Restrictions: Yes RLE Weight Bearing: Non weight bearing LLE Weight Bearing: Non weight bearing   Pertinent Vitals/Pain Pain with mobility      Mobility  Bed Mobility Bed Mobility: Sitting - Scoot to Edge of Bed Sitting - Scoot to Edge of Bed: 1: +2 Total assist Sitting - Scoot to Edge of Bed: Patient Percentage: 60% Details for Bed Mobility Assistance: from long sitting position, pt was able to to scoot to edge of bed with asssit at his back and helping to scoot and nursing tech holding legs and guideing them out in front so no stress was on the knee or ankle joings Transfers Transfers: Counselling psychologist Transfer: 3: Mod assist Details for Transfer Assistance: pt was able to lift on upper body to scoot back into chair Legs were left on bed as pt felt comfortable.  Nursing tech in room and expressed confidence they would be able to get patient back to bed    Exercises     PT Diagnosis: Acute pain  PT Problem List:  Decreased range of motion;Pain;Decreased knowledge of precautions;Other (comment) (decreased weight bearing (NWB both legs)) PT Treatment Interventions: Therapeutic exercise;Functional mobility training;Patient/family education   PT Goals Acute Rehab PT Goals PT Goal Formulation: With patient Time For Goal Achievement: 04/17/12 Potential to Achieve Goals: Good Pt will Transfer Bed to Chair/Chair to Bed: with modified independence PT Transfer Goal: Bed to Chair/Chair to Bed - Progress: Goal set today Pt will Perform Home Exercise Program: Independently PT Goal: Perform Home Exercise Program - Progress: Goal set today  Visit Information  Last PT Received On: 04/03/12    Subjective Data  Subjective: II'd like to go ahead and see what we are looking at  Patient Stated Goal: to go to surgery on Monday   Prior Functioning  Home Living Lives With: Spouse Available Help at Discharge: Available PRN/intermittently;Other (Comment) (wife works during the day) Type of Home: House Home Access: Stairs to enter Secretary/administrator of Steps: 2 Entrance Stairs-Rails: None Home Layout: One level Home Adaptive Equipment: Walker - rolling Prior Function Level of Independence: Independent Able to Take Stairs?: Yes Driving: Yes Vocation: Full time employment Communication Communication: No difficulties Dominant Hand: Right    Cognition  Cognition Overall Cognitive Status: Appears within functional limits for tasks assessed/performed Arousal/Alertness: Awake/alert Orientation Level: Appears intact for tasks assessed Behavior During Session: [facility] for tasks performed    Extremity/Trunk Assessment Right Lower Extremity Assessment RLE ROM/Strength/Tone: Deficits RLE ROM/Strength/Tone Deficits: external fixator with velfoam  strap keeping foot in neutral dorsiflexion toes  feel "a little bit  numb", but he can feel them Left Lower Extremity Assessment LLE ROM/Strength/Tone: Deficits LLE  ROM/Strength/Tone Deficits: rigid dressing on ankle, soft dressing over knee with ice and knee immobilizer, toes moveable  and pt able to locate touch Trunk Assessment Trunk Assessment: Normal   Balance     End of Session PT - End of Session Activity Tolerance: Patient tolerated treatment well Patient left: in chair (with legs out front supported on bed) Nurse Communication: Mobility status  GP    Bayard Hugger. [place], [place] [phone] 04/03/2012, 4:45 PM
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
## 2012-04-03 NOTE — Progress Notes (Signed)
Occupational Therapy Treatment Patient Details Name: [name] [name] MRN: [id number] DOB: Oct 10, 1960 Today's Date: 04/03/2012 Time: 3086-5784 OT Time Calculation (min): 8 min  OT Assessment / Plan / Recommendation Comments on Treatment Session "foot plate"  Maintaining good position/alignment of LE.  Location of velcro changed to reduce interference with dressing changes.     Follow Up Recommendations       Barriers to Discharge       Equipment Recommendations       Recommendations for Other Services    Frequency     Plan      Precautions / Restrictions Precautions Precautions: Other (comment) (ex fixator Rt. LE) Restrictions Weight Bearing Restrictions: Yes RLE Weight Bearing: Non weight bearing LLE Weight Bearing: Non weight bearing   Pertinent Vitals/Pain     ADL  ADL Comments: spoke with RN who reports no difficulty with foot plate.  They did request OT change position of velcro due to it interfering with dressing change.  Velcro location changed.  Answered pt questions.  Good position/alignment of foot    OT Diagnosis:    OT Problem List:   OT Treatment Interventions:     OT Goals ADL Goals ADL Goal: Additional Goal #1 - Progress: Met  Visit Information  Last OT Received On: 04/03/12    Subjective Data      Prior Functioning  Home Living Lives With: Spouse Available Help at Discharge: Available PRN/intermittently;Other (Comment) (wife works during the day) Type of Home: House Home Access: Stairs to enter Secretary/administrator of Steps: 2 Entrance Stairs-Rails: None Home Layout: One level Home Adaptive Equipment: Walker - rolling Prior Function Level of Independence: Independent Able to Take Stairs?: Yes Driving: Yes Vocation: Full time employment Communication Communication: No difficulties Dominant Hand: Right    Cognition  Cognition Overall Cognitive Status: Appears within functional limits for tasks assessed/performed Arousal/Alertness:  Awake/alert Orientation Level: Appears intact for tasks assessed Behavior During Session: [facility] for tasks performed    Mobility  Bed Mobility Bed Mobility: Sitting - Scoot to Edge of Bed Sitting - Scoot to [place] of Bed: 1: +2 Total assist Sitting - Scoot to Edge of Bed: Patient Percentage: 60% Details for Bed Mobility Assistance: from long sitting position, pt was able to to scoot to edge of bed with asssit at his back and helping to scoot and nursing tech holding legs and guideing them out in front so no stress was on the knee or ankle joings Transfers Details for Transfer Assistance: pt was able to lift on upper body to scoot back into chair Legs were left on bed as pt felt comfortable.  Nursing tech in room and expressed confidence they would be able to get patient back to bed    Exercises      Balance     End of Session OT - End of Session Activity Tolerance: Patient tolerated treatment well Patient left: in bed;with call bell/phone within reach Nurse Communication: Other (comment) (foot plate )  GO     Gari Hartsell, Ursula Alert M 04/03/2012, 5:22 PM

## 2012-04-03 NOTE — Evaluation (Signed)
Occupational Therapy Evaluation Patient Details Name: [name] [name] MRN: [id number] DOB: February 01, 1961 Today's Date: 04/03/2012 Time: 5784-6962 OT Time Calculation (min): 19 min  OT Assessment / Plan / Recommendation Clinical Impression  This 52 y.o. male admitted with multiple LEs fx.  Rt. LE with Ex fixator.  OT ordered to fabricate foot plate.  "Foot plate" using 4" velfoam fabricated.  Will monitor to ensure proper fit    OT Assessment  Patient needs continued OT Services    Follow Up Recommendations  Other (comment) (OT for foot plate only)    Barriers to Discharge      Equipment Recommendations  None recommended by OT    Recommendations for Other Services    Frequency  Min 2X/week    Precautions / Restrictions Precautions Precautions: Other (comment) (ex fix Lt. LE) Restrictions Weight Bearing Restrictions: Yes RLE Weight Bearing: Non weight bearing LLE Weight Bearing: Non weight bearing       ADL  ADL Comments: OT ordered for to fabricate foot plate.  Using 4" velfoam, a "foot plate" was fabricated positioning pt in neutral 90* dorsi flexion.  Pt. tolerated well.  Explained purpose and procedure to pt,  and instructed nursing how to remove and replace velfoam.      OT Diagnosis: Acute pain;Generalized weakness  OT Problem List: Decreased knowledge of precautions OT Treatment Interventions: Splinting;Patient/family education   OT Goals Acute Rehab OT Goals OT Goal Formulation: With patient Time For Goal Achievement: 05/11/12 Potential to Achieve Goals: Good ADL Goals Additional ADL Goal #1: Pt will tolerate foot plate Rt. LE ADL Goal: Additional Goal #1 - Progress: Goal set today  Visit Information  Last OT Received On: 04/03/12    Subjective Data  Subjective: "It's not too bad (re: pain), but I will let you know if it is" Patient Stated Goal: Pt did not state   Prior Functioning     Home Living Lives With: Spouse Prior Function Level of Independence:  Independent Able to Take Stairs?: Yes Driving: Yes Vocation: Full time employment Communication Communication: No difficulties Dominant Hand: Right         Vision/Perception     Cognition  Cognition Overall Cognitive Status: Appears within functional limits for tasks assessed/performed Arousal/Alertness: Awake/alert Orientation Level: Appears intact for tasks assessed Behavior During Session: [facility] for tasks performed    Extremity/Trunk Assessment       Mobility       Exercise     Balance     End of Session OT - End of Session Activity Tolerance: Patient tolerated treatment well Patient left: in bed;with call bell/phone within reach Nurse Communication: Other (comment) (instructions for foot plate)  GO     Deetra Booton M 04/03/2012, 10:54 AM

## 2012-04-03 NOTE — Plan of Care (Signed)
Problem: Food- and Nutrition-Related Knowledge Deficit (NB-1.1) Goal: Nutrition education Formal process to instruct or train a patient/client in a skill or to impart knowledge to help patients/clients voluntarily manage or modify food choices and eating behavior to maintain or improve health. Outcome: Completed/Met Date Met:  04/03/12 RD consulted for nutrition education regarding weight loss and Vitamin D.  RD provided "Normal Nutrition" handout from the Academy of [employer]etics. Emphasized the importance of vitamin D and calcium in diet and highlighted sources of calcium and vitamin D. Discussed importance of controlled and consistent intake throughout the day.   Encouraged pt to discuss physical activity options with physician and the effects of exercise on bone health. Teach back method used.  Expect good compliance.  Current diet order is Regular, patient is consuming approximately 60% of meals at this time. Labs and medications reviewed. No further nutrition interventions warranted at this time. RD contact information provided. If additional nutrition issues arise, please re-consult RD.  Loyce Dys, MS RD LDN Clinical Inpatient Dietitian Pager: [phone] Weekend/After hours pager: [phone]

## 2012-04-04 LAB — COMPREHENSIVE METABOLIC PANEL
AST: 25 U/L (ref 0–37)
Albumin: 2.7 g/dL — ABNORMAL LOW (ref 3.5–5.2)
BUN: 8 mg/dL (ref 6–23)
Creatinine, Ser: 0.63 mg/dL (ref 0.50–1.35)
Total Protein: 6.1 g/dL (ref 6.0–8.3)

## 2012-04-04 MED ORDER — DIPHENHYDRAMINE HCL 25 MG PO CAPS
25.0000 mg | ORAL_CAPSULE | Freq: Four times a day (QID) | ORAL | Status: DC | PRN
  Administered 2012-04-04: 25 mg via ORAL
  Filled 2012-04-04: qty 1

## 2012-04-04 NOTE — Progress Notes (Signed)
Back to bed from chair - moderate assist. X 2. Denies need for pain med at present. Ice packs applied to LEs.
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
## 2012-04-04 NOTE — Progress Notes (Signed)
Subjective: 3 Days Post-Op Procedure(s) (LRB): TIBIAL PLATEAU/External Fixation (Right) Patient reports pain as moderate.    Objective: Vital signs in last 24 hours: Temp:  [98 F (36.7 C)-99 F (37.2 C)] 98 F (36.7 C) (03/01 0559) Pulse Rate:  [90-97] 90 (03/01 0559) Resp:  [10-20] 18 (03/01 0559) BP: (141-151)/(63-82) 151/74 mmHg (03/01 0559) SpO2:  [93 %-98 %] 94 % (03/01 0559)  Intake/Output from previous day: 02/28 0701 - 03/01 0700 In: 1080 [P.O.:1080] Out: 1050 [Urine:1050] Intake/Output this shift:     Recent Labs  04/01/12 1245 04/01/12 2016 04/02/12 0552 04/03/12 0505  HGB 14.3 12.7* 11.0* 10.4*    Recent Labs  04/02/12 0552 04/03/12 0505  WBC 7.4 8.2  RBC 3.43* 3.21*  HCT 33.2* 31.4*  PLT 220 200    Recent Labs  04/03/12 0505 04/04/12 0643  NA 137 138  K 4.1 3.8  CL 100 100  CO2 30 31  BUN 9 8  CREATININE 0.64 0.63  GLUCOSE 105* 124*  CALCIUM 8.7 9.0   No results found for this basename: LABPT, INR,  in the last 72 hours  Dorsiflexion/Plantar flexion intact  Assessment/Plan: 3 Days Post-Op Procedure(s) (LRB): TIBIAL PLATEAU/External Fixation (Right) Surgery Monday apparently  [name] [name],[name] [name] 04/04/2012, 10:06 AM

## 2012-04-04 NOTE — Progress Notes (Signed)
Physical Therapy Treatment Patient Details Name: [name] [name] MRN: [id number] DOB: 01/26/1961 Today's Date: 04/04/2012 Time: 6295-2841 PT Time Calculation (min): 29 min  PT Assessment / Plan / Recommendation Comments on Treatment Session  Pt progressing towards goals with less asisitance and cues needed with mobility today.     Follow Up Recommendations  Home health PT           Equipment Recommendations  Wheelchair (measurements PT);Wheelchair cushion (measurements PT)       Frequency Min 6X/week   Plan Discharge plan remains appropriate;Frequency remains appropriate    Precautions / Restrictions Precautions Precautions: Fall Restrictions RLE Weight Bearing: Non weight bearing LLE Weight Bearing: Non weight bearing       Mobility  Bed Mobility Bed Mobility: Supine to Sit Supine to Sit: 4: Min assist Sitting - Scoot to Edge of Bed: 4: Min assist Details for Bed Mobility Assistance: min assist to come up into long sitting postion, mod assist to move LE"s into bed into postion for posterior transfer into recliner.  Transfers Transfers: Risk manager: 3: Mod assist Details for Transfer Assistance: pt was able to use UE's to move body/trunk posterior into recliner with min assit to guide legs along with the movements. LE's were left supported on the bed surface.      PT Goals Acute Rehab PT Goals PT Transfer Goal: Bed to Chair/Chair to Bed - Progress: Progressing toward goal  Visit Information  Assistance Needed: +1    Subjective Data  Subjective: No new complaints, agreeable to therapy today.   Cognition  Cognition Overall Cognitive Status: Appears within functional limits for tasks assessed/performed Arousal/Alertness: Awake/alert Orientation Level: Appears intact for tasks assessed Behavior During Session: [facility] for tasks performed       End of Session PT - End of Session Activity Tolerance: Patient tolerated  treatment well Patient left: in chair Nurse Communication: Mobility status   GP     Sallyanne Kuster 04/04/2012, 4:18 PM  Sallyanne Kuster, PTA Office- [phone]

## 2012-04-05 ENCOUNTER — Encounter (HOSPITAL_COMMUNITY): Payer: Self-pay | Admitting: Orthopedic Surgery

## 2012-04-05 LAB — CBC
HCT: 30.5 % — ABNORMAL LOW (ref 39.0–52.0)
Hemoglobin: 10.3 g/dL — ABNORMAL LOW (ref 13.0–17.0)
MCV: 94.7 fL (ref 78.0–100.0)
RBC: 3.22 MIL/uL — ABNORMAL LOW (ref 4.22–5.81)
WBC: 7.9 10*3/uL (ref 4.0–10.5)

## 2012-04-05 NOTE — Progress Notes (Signed)
Subjective: 4 Days Post-Op Procedure(s) (LRB): TIBIAL PLATEAU/External Fixation (Right) Patient reports pain as moderate.    Objective: Vital signs in last 24 hours: Temp:  [98.2 F (36.8 C)-99.1 F (37.3 C)] 99.1 F (37.3 C) (03/02 0644) Pulse Rate:  [84-96] 84 (03/02 0644) Resp:  [16] 16 (03/02 0644) BP: (136-143)/(67-84) 137/84 mmHg (03/02 0644) SpO2:  [92 %-95 %] 95 % (03/02 0644)  Intake/Output from previous day: 03/01 0701 - 03/02 0700 In: 1320 [P.O.:1320] Out: -  Intake/Output this shift:     Recent Labs  04/03/12 0505 04/05/12 0647  HGB 10.4* 10.3*    Recent Labs  04/03/12 0505 04/05/12 0647  WBC 8.2 7.9  RBC 3.21* 3.22*  HCT 31.4* 30.5*  PLT 200 298    Recent Labs  04/03/12 0505 04/04/12 0643  NA 137 138  K 4.1 3.8  CL 100 100  CO2 30 31  BUN 9 8  CREATININE 0.64 0.63  GLUCOSE 105* 124*  CALCIUM 8.7 9.0   No results found for this basename: LABPT, INR,  in the last 72 hours  Neurovascular intact Sensation intact distally Incision: moderate drainage from distal ex fix pins, dressing changed Wiggles toes, capillary refill intact  Assessment/Plan: 4 Days Post-Op Procedure(s) (LRB): TIBIAL PLATEAU/External Fixation (Right) NWB bilat LE DVT proph with lovenox hold tomorrow morning for planned surgery Surgery tomorrow with primary team NPO after midnight tonight    Margart Sickles 04/05/2012, 12:13 PM

## 2012-04-06 ENCOUNTER — Inpatient Hospital Stay (HOSPITAL_COMMUNITY): Payer: Worker's Compensation | Admitting: Certified Registered"

## 2012-04-06 ENCOUNTER — Encounter (HOSPITAL_COMMUNITY): Payer: Self-pay | Admitting: Certified Registered"

## 2012-04-06 ENCOUNTER — Inpatient Hospital Stay (HOSPITAL_COMMUNITY): Payer: Worker's Compensation

## 2012-04-06 ENCOUNTER — Encounter (HOSPITAL_COMMUNITY)
Admission: EM | Disposition: A | Discharge status: Skilled Nursing Facility | Payer: Self-pay | Source: Home / Self Care | Attending: Orthopedic Surgery

## 2012-04-06 HISTORY — PX: ORIF FEMUR FRACTURE: SHX2119

## 2012-04-06 LAB — TESTOSTERONE, FREE: Testosterone, Free: 8.5 pg/mL — ABNORMAL LOW (ref 47.0–244.0)

## 2012-04-06 LAB — SEX HORMONE BINDING GLOBULIN: Sex Hormone Binding: 18 nmol/L (ref 13–71)

## 2012-04-06 LAB — PTH, INTACT AND CALCIUM
Calcium, Total (PTH): 8.3 mg/dL — ABNORMAL LOW (ref 8.4–10.5)
PTH: 67 pg/mL (ref 14.0–72.0)

## 2012-04-06 SURGERY — OPEN REDUCTION INTERNAL FIXATION (ORIF) DISTAL FEMUR FRACTURE
Anesthesia: General | Laterality: Left

## 2012-04-06 MED ORDER — ACETAMINOPHEN 10 MG/ML IV SOLN
1000.0000 mg | Freq: Four times a day (QID) | INTRAVENOUS | Status: AC
  Administered 2012-04-06 – 2012-04-07 (×4): 1000 mg via INTRAVENOUS
  Filled 2012-04-06 (×3): qty 100

## 2012-04-06 MED ORDER — NEOSTIGMINE METHYLSULFATE 1 MG/ML IJ SOLN
INTRAMUSCULAR | Status: DC | PRN
  Administered 2012-04-06: 4 mg via INTRAVENOUS

## 2012-04-06 MED ORDER — HYDROMORPHONE HCL PF 1 MG/ML IJ SOLN
INTRAMUSCULAR | Status: AC
  Filled 2012-04-06: qty 1

## 2012-04-06 MED ORDER — ENOXAPARIN SODIUM 40 MG/0.4ML ~~LOC~~ SOLN
40.0000 mg | SUBCUTANEOUS | Status: DC
  Administered 2012-04-07 – 2012-04-13 (×7): 40 mg via SUBCUTANEOUS
  Filled 2012-04-06 (×9): qty 0.4

## 2012-04-06 MED ORDER — 0.9 % SODIUM CHLORIDE (POUR BTL) OPTIME
TOPICAL | Status: DC | PRN
  Administered 2012-04-06: 1000 mL

## 2012-04-06 MED ORDER — MORPHINE SULFATE (PF) 1 MG/ML IV SOLN
INTRAVENOUS | Status: DC
  Administered 2012-04-06: 15 mg via INTRAVENOUS
  Administered 2012-04-06: 18:00:00 via INTRAVENOUS
  Administered 2012-04-06: 15 mg via INTRAVENOUS
  Administered 2012-04-07: 14.99 mg via INTRAVENOUS
  Administered 2012-04-07: 18 mg via INTRAVENOUS
  Administered 2012-04-07: 9 mg via INTRAVENOUS
  Filled 2012-04-06 (×2): qty 25

## 2012-04-06 MED ORDER — POLYETHYLENE GLYCOL 3350 17 G PO PACK
17.0000 g | PACK | Freq: Every day | ORAL | Status: DC
  Administered 2012-04-06 – 2012-04-07 (×2): 17 g via ORAL
  Filled 2012-04-06 (×11): qty 1

## 2012-04-06 MED ORDER — FERROUS SULFATE 325 (65 FE) MG PO TABS
325.0000 mg | ORAL_TABLET | Freq: Three times a day (TID) | ORAL | Status: DC
  Administered 2012-04-07 – 2012-04-15 (×13): 325 mg via ORAL
  Filled 2012-04-06 (×28): qty 1

## 2012-04-06 MED ORDER — MIDAZOLAM HCL 2 MG/2ML IJ SOLN: 1.0000 mg | INTRAMUSCULAR | Status: DC | PRN

## 2012-04-06 MED ORDER — NALOXONE HCL 0.4 MG/ML IJ SOLN: 0.4000 mg | INTRAMUSCULAR | Status: DC | PRN

## 2012-04-06 MED ORDER — FENTANYL CITRATE 0.05 MG/ML IJ SOLN: 50.0000 ug | Freq: Once | INTRAMUSCULAR | Status: DC

## 2012-04-06 MED ORDER — METOCLOPRAMIDE HCL 5 MG/ML IJ SOLN: 5.0000 mg | Freq: Three times a day (TID) | INTRAMUSCULAR | Status: DC | PRN

## 2012-04-06 MED ORDER — METOCLOPRAMIDE HCL 10 MG PO TABS
5.0000 mg | ORAL_TABLET | Freq: Three times a day (TID) | ORAL | Status: DC | PRN
  Administered 2012-04-07 – 2012-04-09 (×3): 10 mg via ORAL
  Filled 2012-04-06 (×2): qty 1

## 2012-04-06 MED ORDER — SODIUM CHLORIDE 0.9 % IR SOLN
Status: DC | PRN
  Administered 2012-04-06: 3000 mL

## 2012-04-06 MED ORDER — LACTATED RINGERS IV SOLN
INTRAVENOUS | Status: DC
  Administered 2012-04-06: 12:00:00 via INTRAVENOUS

## 2012-04-06 MED ORDER — ONDANSETRON HCL 4 MG/2ML IJ SOLN: 4.0000 mg | Freq: Four times a day (QID) | INTRAMUSCULAR | Status: DC | PRN

## 2012-04-06 MED ORDER — SORBITOL 70 % SOLN
30.0000 mL | Freq: Every day | Status: DC | PRN
  Administered 2012-04-08: 30 mL via ORAL
  Filled 2012-04-06: qty 30

## 2012-04-06 MED ORDER — PROMETHAZINE HCL 25 MG/ML IJ SOLN: 6.2500 mg | INTRAMUSCULAR | Status: DC | PRN

## 2012-04-06 MED ORDER — HYDROMORPHONE HCL PF 1 MG/ML IJ SOLN
0.2500 mg | INTRAMUSCULAR | Status: DC | PRN
  Administered 2012-04-06 (×3): 0.5 mg via INTRAVENOUS

## 2012-04-06 MED ORDER — GLYCOPYRROLATE 0.2 MG/ML IJ SOLN
INTRAMUSCULAR | Status: DC | PRN
  Administered 2012-04-06: 0.6 mg via INTRAVENOUS

## 2012-04-06 MED ORDER — ACETAMINOPHEN 10 MG/ML IV SOLN
INTRAVENOUS | Status: AC
  Filled 2012-04-06: qty 100

## 2012-04-06 MED ORDER — FENTANYL CITRATE 0.05 MG/ML IJ SOLN
INTRAMUSCULAR | Status: DC | PRN
  Administered 2012-04-06: 100 ug via INTRAVENOUS
  Administered 2012-04-06: 50 ug via INTRAVENOUS
  Administered 2012-04-06: 100 ug via INTRAVENOUS
  Administered 2012-04-06 (×3): 50 ug via INTRAVENOUS

## 2012-04-06 MED ORDER — DIPHENHYDRAMINE HCL 12.5 MG/5ML PO ELIX: 12.5000 mg | ORAL_SOLUTION | Freq: Four times a day (QID) | ORAL | Status: DC | PRN

## 2012-04-06 MED ORDER — CEFAZOLIN SODIUM 1-5 GM-% IV SOLN
1.0000 g | Freq: Three times a day (TID) | INTRAVENOUS | Status: AC
  Administered 2012-04-06 – 2012-04-07 (×3): 1 g via INTRAVENOUS
  Filled 2012-04-06 (×5): qty 50

## 2012-04-06 MED ORDER — SODIUM CHLORIDE 0.9 % IJ SOLN: 9.0000 mL | INTRAMUSCULAR | Status: DC | PRN

## 2012-04-06 MED ORDER — CEFAZOLIN SODIUM-DEXTROSE 2-3 GM-% IV SOLR
INTRAVENOUS | Status: AC
  Administered 2012-04-06: 2 g via INTRAVENOUS
  Filled 2012-04-06: qty 50

## 2012-04-06 MED ORDER — POTASSIUM CHLORIDE IN NACL 20-0.9 MEQ/L-% IV SOLN
INTRAVENOUS | Status: DC
  Administered 2012-04-06: 21:00:00 via INTRAVENOUS
  Filled 2012-04-06 (×5): qty 1000

## 2012-04-06 MED ORDER — DIPHENHYDRAMINE HCL 12.5 MG/5ML PO ELIX: 12.5000 mg | ORAL_SOLUTION | ORAL | Status: DC | PRN

## 2012-04-06 MED ORDER — DIPHENHYDRAMINE HCL 50 MG/ML IJ SOLN: 12.5000 mg | Freq: Four times a day (QID) | INTRAMUSCULAR | Status: DC | PRN

## 2012-04-06 MED ORDER — ONDANSETRON HCL 4 MG PO TABS: 4.0000 mg | ORAL_TABLET | Freq: Four times a day (QID) | ORAL | Status: DC | PRN

## 2012-04-06 MED ORDER — ARTIFICIAL TEARS OP OINT
TOPICAL_OINTMENT | OPHTHALMIC | Status: DC | PRN
  Administered 2012-04-06: 1 via OPHTHALMIC

## 2012-04-06 MED ORDER — MORPHINE SULFATE (PF) 1 MG/ML IV SOLN
INTRAVENOUS | Status: AC
  Filled 2012-04-06: qty 25

## 2012-04-06 MED ORDER — PROPOFOL 10 MG/ML IV BOLUS
INTRAVENOUS | Status: DC | PRN
  Administered 2012-04-06: 200 mg via INTRAVENOUS

## 2012-04-06 MED ORDER — EPHEDRINE SULFATE 50 MG/ML IJ SOLN
INTRAMUSCULAR | Status: DC | PRN
  Administered 2012-04-06 (×2): 10 mg via INTRAVENOUS

## 2012-04-06 MED ORDER — ONDANSETRON HCL 4 MG/2ML IJ SOLN
INTRAMUSCULAR | Status: DC | PRN
  Administered 2012-04-06: 4 mg via INTRAVENOUS

## 2012-04-06 MED ORDER — LACTATED RINGERS IV SOLN
INTRAVENOUS | Status: DC | PRN
  Administered 2012-04-06 (×2): via INTRAVENOUS

## 2012-04-06 MED ORDER — ROCURONIUM BROMIDE 100 MG/10ML IV SOLN
INTRAVENOUS | Status: DC | PRN
  Administered 2012-04-06: 60 mg via INTRAVENOUS
  Administered 2012-04-06: 40 mg via INTRAVENOUS

## 2012-04-06 MED ORDER — MAGNESIUM CITRATE PO SOLN
1.0000 | Freq: Once | ORAL | Status: AC | PRN
  Filled 2012-04-06: qty 296

## 2012-04-06 SURGICAL SUPPLY — 100 items
3.5 mm Cortex Screws, self-tapping, stardrive rece ×2 IMPLANT
3.5mm Low Profile Reconstruction Plate 6 hole ×2 IMPLANT
BANDAGE ELASTIC 4 VELCRO ST LF (GAUZE/BANDAGES/DRESSINGS) ×9 IMPLANT
BANDAGE ELASTIC 6 VELCRO ST LF (GAUZE/BANDAGES/DRESSINGS) ×1 IMPLANT
BANDAGE ESMARK 6X9 LF (GAUZE/BANDAGES/DRESSINGS) ×2 IMPLANT
BANDAGE GAUZE ELAST BULKY 4 IN (GAUZE/BANDAGES/DRESSINGS) ×10 IMPLANT
BIT DRILL 2.5X110 QC LCP DISP (BIT) ×2 IMPLANT
BIT DRILL FLUTED 2.5 (BIT) ×1 IMPLANT
BIT DRILL QC 3.5X110 (BIT) ×2 IMPLANT
BLADE SURG 10 STRL SS (BLADE) ×3 IMPLANT
BLADE SURG 15 STRL LF DISP TIS (BLADE) ×2 IMPLANT
BLADE SURG 15 STRL SS (BLADE) ×3
BLADE SURG ROTATE 9660 (MISCELLANEOUS) IMPLANT
BNDG CMPR 9X6 STRL LF SNTH (GAUZE/BANDAGES/DRESSINGS) ×2
BNDG COHESIVE 4X5 TAN STRL (GAUZE/BANDAGES/DRESSINGS) ×1 IMPLANT
BNDG COHESIVE 6X5 TAN STRL LF (GAUZE/BANDAGES/DRESSINGS) ×1 IMPLANT
BNDG ESMARK 6X9 LF (GAUZE/BANDAGES/DRESSINGS) ×3
BRUSH SCRUB DISP (MISCELLANEOUS) ×6 IMPLANT
CLEANER TIP ELECTROSURG 2X2 (MISCELLANEOUS) ×3 IMPLANT
CLOTH BEACON ORANGE TIMEOUT ST (SAFETY) ×3 IMPLANT
COVER MAYO STAND STRL (DRAPES) ×3 IMPLANT
COVER SURGICAL LIGHT HANDLE (MISCELLANEOUS) ×4 IMPLANT
CUFF TOURNIQUET SINGLE 18IN (TOURNIQUET CUFF) IMPLANT
CUFF TOURNIQUET SINGLE 24IN (TOURNIQUET CUFF) IMPLANT
CUFF TOURNIQUET SINGLE 34IN LL (TOURNIQUET CUFF) IMPLANT
DRAPE C-ARM 42X72 X-RAY (DRAPES) ×3 IMPLANT
DRAPE C-ARMOR (DRAPES) ×3 IMPLANT
DRAPE INCISE IOBAN 66X45 STRL (DRAPES) ×1 IMPLANT
DRAPE OEC MINIVIEW 54X84 (DRAPES) ×1 IMPLANT
DRAPE ORTHO SPLIT 77X108 STRL (DRAPES)
DRAPE SURG ORHT 6 SPLT 77X108 (DRAPES) ×3 IMPLANT
DRAPE U-SHAPE 47X51 STRL (DRAPES) ×3 IMPLANT
DRILL BIT FLUTED 2.5 (BIT) ×3
DRSG ADAPTIC 3X8 NADH LF (GAUZE/BANDAGES/DRESSINGS) ×1 IMPLANT
DRSG MEPITEL 3X4 ME34 (GAUZE/BANDAGES/DRESSINGS) ×4 IMPLANT
DRSG PAD ABDOMINAL 8X10 ST (GAUZE/BANDAGES/DRESSINGS) ×8 IMPLANT
ELECT REM PT RETURN 9FT ADLT (ELECTROSURGICAL) ×3
ELECTRODE REM PT RTRN 9FT ADLT (ELECTROSURGICAL) ×2 IMPLANT
EVACUATOR 1/8 PVC DRAIN (DRAIN) IMPLANT
EVACUATOR 3/16  PVC DRAIN (DRAIN)
EVACUATOR 3/16 PVC DRAIN (DRAIN) IMPLANT
GLOVE BIO SURGEON STRL SZ7.5 (GLOVE) ×5 IMPLANT
GLOVE BIO SURGEON STRL SZ8 (GLOVE) ×5 IMPLANT
GLOVE BIOGEL PI IND STRL 7.5 (GLOVE) ×2 IMPLANT
GLOVE BIOGEL PI IND STRL 8 (GLOVE) ×2 IMPLANT
GLOVE BIOGEL PI INDICATOR 7.5 (GLOVE) ×1
GLOVE BIOGEL PI INDICATOR 8 (GLOVE) ×1
GLOVE BIOGEL PI ORTHO PRO SZ7 (GLOVE) ×1
GLOVE PI ORTHO PRO STRL SZ7 (GLOVE) ×1 IMPLANT
GLOVE SURG SS PI 7.0 STRL IVOR (GLOVE) ×2 IMPLANT
GOWN PREVENTION PLUS XLARGE (GOWN DISPOSABLE) ×3 IMPLANT
GOWN STRL NON-REIN LRG LVL3 (GOWN DISPOSABLE) ×4 IMPLANT
GOWN STRL REIN XL XLG (GOWN DISPOSABLE) ×2 IMPLANT
GUIDEPIN 2.8X300 THRD TIP OIC (Screw) ×10 IMPLANT
IMMOBILIZER KNEE 22 UNIV (SOFTGOODS) ×1 IMPLANT
KIT BASIN OR (CUSTOM PROCEDURE TRAY) ×3 IMPLANT
KIT ROOM TURNOVER OR (KITS) ×3 IMPLANT
MANIFOLD NEPTUNE II (INSTRUMENTS) ×3 IMPLANT
NDL SUT 6 .5 CRC .975X.05 MAYO (NEEDLE) IMPLANT
NEEDLE 22X1 1/2 (OR ONLY) (NEEDLE) IMPLANT
NEEDLE MAYO TAPER (NEEDLE)
NS IRRIG 1000ML POUR BTL (IV SOLUTION) ×3 IMPLANT
PACK ORTHO EXTREMITY (CUSTOM PROCEDURE TRAY) ×3 IMPLANT
PACK TOTAL JOINT (CUSTOM PROCEDURE TRAY) ×3 IMPLANT
PAD ARMBOARD 7.5X6 YLW CONV (MISCELLANEOUS) ×6 IMPLANT
PAD CAST 4YDX4 CTTN HI CHSV (CAST SUPPLIES) ×2 IMPLANT
PADDING CAST COTTON 4X4 STRL (CAST SUPPLIES) ×3
PADDING CAST COTTON 6X4 STRL (CAST SUPPLIES) ×7 IMPLANT
SCREW CANN 7.3X70 32MM THRD (Screw) ×2 IMPLANT
SCREW CANN 7.3X80 32MM THRD (Screw) ×2 IMPLANT
SCREW CANN 7.3X90 32MM THRD (Screw) ×2 IMPLANT
SCREW CORTEX 3.5 50MM (Screw) ×2 IMPLANT
SCREW PELVIC CORTEX 50MM (Screw) ×2 IMPLANT
SPLINT PLASTER CAST XFAST 5X30 (CAST SUPPLIES) ×3 IMPLANT
SPLINT PLASTER XFAST SET 5X30 (CAST SUPPLIES) ×3
SPONGE GAUZE 4X4 12PLY (GAUZE/BANDAGES/DRESSINGS) ×7 IMPLANT
SPONGE LAP 18X18 X RAY DECT (DISPOSABLE) ×1 IMPLANT
SPONGE SCRUB IODOPHOR (GAUZE/BANDAGES/DRESSINGS) ×3 IMPLANT
STAPLER VISISTAT 35W (STAPLE) ×3 IMPLANT
STOCKINETTE IMPERVIOUS LG (DRAPES) ×3 IMPLANT
STRIP CLOSURE SKIN 1/2X4 (GAUZE/BANDAGES/DRESSINGS) IMPLANT
SUCTION FRAZIER TIP 10 FR DISP (SUCTIONS) ×3 IMPLANT
SUT ETHILON 3 0 PS 1 (SUTURE) ×4 IMPLANT
SUT PDS AB 2-0 CT1 27 (SUTURE) IMPLANT
SUT PROLENE 0 CT 2 (SUTURE) ×2 IMPLANT
SUT VIC AB 0 CT1 27 (SUTURE) ×6
SUT VIC AB 0 CT1 27XBRD ANBCTR (SUTURE) ×4 IMPLANT
SUT VIC AB 1 CT1 27 (SUTURE) ×6
SUT VIC AB 1 CT1 27XBRD ANBCTR (SUTURE) ×4 IMPLANT
SUT VIC AB 2-0 CT1 27 (SUTURE) ×6
SUT VIC AB 2-0 CT1 TAPERPNT 27 (SUTURE) ×4 IMPLANT
SYR 20ML ECCENTRIC (SYRINGE) IMPLANT
SYR CONTROL 10ML LL (SYRINGE) IMPLANT
TOWEL OR 17X24 6PK STRL BLUE (TOWEL DISPOSABLE) ×4 IMPLANT
TOWEL OR 17X26 10 PK STRL BLUE (TOWEL DISPOSABLE) ×6 IMPLANT
TRAY FOLEY CATH 14FR (SET/KITS/TRAYS/PACK) ×2 IMPLANT
TUBE CONNECTING 12X1/4 (SUCTIONS) ×1 IMPLANT
UNDERPAD 30X30 INCONTINENT (UNDERPADS AND DIAPERS) ×3 IMPLANT
WATER STERILE IRR 1000ML POUR (IV SOLUTION) ×2 IMPLANT
YANKAUER SUCT BULB TIP NO VENT (SUCTIONS) ×1 IMPLANT

## 2012-04-06 NOTE — Anesthesia Postprocedure Evaluation (Signed)
  Anesthesia Post-op Note  Patient: [name] [name]  Procedure(s) Performed: Procedure(s) with comments: OPEN REDUCTION INTERNAL FIXATION (ORIF) DISTAL FEMUR FRACTURE (Left) - Biomet Proximal Tibia Plate, 7.3 Cannulated screws, Synthes small fragment set.  Patient Location: PACU  Anesthesia Type:General  Level of Consciousness: awake, alert  and oriented  Airway and Oxygen Therapy: Patient Spontanous Breathing and Patient connected to face mask oxygen  Post-op Pain: mild  Post-op Assessment: Post-op Vital signs reviewed  Post-op Vital Signs: Reviewed  Complications: No apparent anesthesia complications

## 2012-04-06 NOTE — Consult Note (Signed)
I have seen and examined the patient. I agree with the findings above.  L calc minimally displaced and may be amendable to percutaneous fixation L distal femur intercondylar split R tibial plateau and eminence frx  I discussed with the patient the risks and benefits of surgery, including the possibility of infection, nerve injury, vessel injury, wound breakdown, arthritis, symptomatic hardware, DVT/ PE, loss of motion, and need for further surgery among others.  We also specifically discussed the elevated risk of soft tissue breakdown that could lead to amputation.  He understood these risks and wished to proceed with surgical repairs.   Budd Palmer, MD 04/06/2012 1:19 PM

## 2012-04-06 NOTE — Brief Op Note (Signed)
04/01/2012 - 04/06/2012  5:28 PM  PATIENT:  [name] [name]  52 y.o. male  PRE-OPERATIVE DIAGNOSIS:  Left Distal Femur Fracture Right Bicondylar tibial plateau fracture, left calcaneus fracture  POST-OPERATIVE DIAGNOSIS:  Left Distal Femur Fracture Right Bichondylar tibial plateau fracture, left calcaneus fracture  PROCEDURE:   1. ORIF left distal femur 2. ORIF left calcaneus  SURGEON:  Surgeon(s) and Role:    * Budd Palmer, MD - Primary  PHYSICIAN ASSISTANT: Montez Morita, [facility]  ANESTHESIA:   general  EBL:  Total I/O In: 1900 [I.V.:1900] Out: 600 [Urine:600]  BLOOD ADMINISTERED:none  DRAINS: none   LOCAL MEDICATIONS USED:  NONE  SPECIMEN:  No Specimen  DISPOSITION OF SPECIMEN:  N/A  COUNTS:  YES  TOURNIQUET:  * No tourniquets in log *  DICTATION: .Other Dictation: Dictation Number [id number]  PLAN OF CARE: Admit to inpatient   PATIENT DISPOSITION:  PACU - hemodynamically stable.   Delay start of Pharmacological VTE agent (>24hrs) due to surgical blood loss or risk of bleeding: no

## 2012-04-06 NOTE — Progress Notes (Signed)
I have seen and examined the patient. I agree with the findings above. Please see other full note.   Budd Palmer, MD 04/06/2012 1:13 PM

## 2012-04-06 NOTE — Anesthesia Preprocedure Evaluation (Signed)
Anesthesia Evaluation  Patient identified by MRN, date of birth, ID band Patient awake  General Assessment Comment:History of work injury noted.  Reviewed: Allergy & Precautions, H&P , NPO status , Patient's Chart, lab work & pertinent test results  Airway Mallampati: II TM Distance: >3 FB Neck ROM: Full    Dental   Pulmonary neg pulmonary ROS, COPDCurrent Smoker,  + rhonchi         Cardiovascular negative cardio ROS  Rhythm:Regular Rate:Normal     Neuro/Psych    GI/Hepatic negative GI ROS, Neg liver ROS,   Endo/Other  negative endocrine ROS  Renal/GU negative Renal ROS     Musculoskeletal   Abdominal   Peds  Hematology   Anesthesia Other Findings   Reproductive/Obstetrics                           Anesthesia Physical Anesthesia Plan  ASA: II  Anesthesia Plan: General   Post-op Pain Management:    Induction: Intravenous  Airway Management Planned: Oral ETT  Additional Equipment:   Intra-op Plan:   Post-operative Plan: Extubation in OR  Informed Consent: I have reviewed the patients History and Physical, chart, labs and discussed the procedure including the risks, benefits and alternatives for the proposed anesthesia with the patient or authorized representative who has indicated his/her understanding and acceptance.     Plan Discussed with: CRNA and Surgeon  Anesthesia Plan Comments:         Anesthesia Quick Evaluation

## 2012-04-06 NOTE — Anesthesia Procedure Notes (Signed)
Procedure Name: Intubation Date/Time: 04/06/2012 1:33 PM Performed by: Carmela Rima Pre-anesthesia Checklist: Patient identified, Timeout performed, Emergency Drugs available, Suction available and Patient being monitored Patient Re-evaluated:Patient Re-evaluated prior to inductionOxygen Delivery Method: Circle system utilized Preoxygenation: Pre-oxygenation with 100% oxygen Intubation Type: IV induction Ventilation: Mask ventilation without difficulty Laryngoscope Size: Mac and 3 Grade View: Grade II Tube type: Oral Tube size: 8.0 mm Number of attempts: 1 Placement Confirmation: ETT inserted through vocal cords under direct vision,  positive ETCO2 and breath sounds checked- equal and bilateral Secured at: 22 cm Tube secured with: Tape Dental Injury: Teeth and Oropharynx as per pre-operative assessment

## 2012-04-06 NOTE — Progress Notes (Signed)
Md at bedside spoke with patient about surgery

## 2012-04-06 NOTE — Progress Notes (Signed)
Pca initiaited patient states pain is a 10 snoring easily arouses

## 2012-04-06 NOTE — Preoperative (Signed)
Beta Blockers   Reason not to administer Beta Blockers:Not Applicable 

## 2012-04-06 NOTE — Progress Notes (Signed)
Taking ice chips without difficulty, encouraged to cough and deep breathe

## 2012-04-06 NOTE — Transfer of Care (Signed)
Immediate Anesthesia Transfer of Care Note  Patient: [name] [name]  Procedure(s) Performed: Procedure(s) with comments: OPEN REDUCTION INTERNAL FIXATION (ORIF) DISTAL FEMUR FRACTURE (Left) - Biomet Proximal Tibia Plate, 7.3 Cannulated screws, Synthes small fragment set.  Patient Location: PACU  Anesthesia Type:General  Level of Consciousness: awake, alert  and oriented  Airway & Oxygen Therapy: Patient Spontanous Breathing and Patient connected to face mask oxygen  Post-op Assessment: Report given to PACU RN and Post -op Vital signs reviewed and stable  Post vital signs: Reviewed and stable  Complications: No apparent anesthesia complications

## 2012-04-06 NOTE — Progress Notes (Signed)
PT Cancellation Note  Patient Details Name: Chirstopher Iovino MRN: [id number] DOB: [birth year]   Cancelled Treatment:    Reason Eval/Treat Not Completed: Other (comment) (Plan to OR today.  Will f/u tomorrow.  )   Sunny Schlein, [place] [phone] 04/06/2012, 8:09 AM

## 2012-04-07 DIAGNOSIS — E291 Testicular hypofunction: Secondary | ICD-10-CM

## 2012-04-07 HISTORY — DX: Testicular hypofunction: E29.1

## 2012-04-07 LAB — BASIC METABOLIC PANEL
BUN: 15 mg/dL (ref 6–23)
Calcium: 8.7 mg/dL (ref 8.4–10.5)
Creatinine, Ser: 0.73 mg/dL (ref 0.50–1.35)
GFR calc Af Amer: >90 mL/min (ref >90)
GFR calc non Af Amer: >90 mL/min (ref >90)
Glucose, Bld: 148 mg/dL — ABNORMAL HIGH (ref 70–99)

## 2012-04-07 LAB — TESTOSTERONE, FREE: Testosterone, Free: 3.9 pg/mL — ABNORMAL LOW (ref 47.0–244.0)

## 2012-04-07 LAB — TESTOSTERONE, % FREE: Testosterone-% Free: 2.4 % — ABNORMAL HIGH (ref 1.6–2.9)

## 2012-04-07 LAB — CBC
MCH: 31.7 pg (ref 26.0–34.0)
MCHC: 33.3 g/dL (ref 30.0–36.0)
Platelets: 333 10*3/uL (ref 150–400)
RDW: 13.1 % (ref 11.5–15.5)

## 2012-04-07 LAB — SEX HORMONE BINDING GLOBULIN: Sex Hormone Binding: 18 nmol/L (ref 13–71)

## 2012-04-07 MED ORDER — MAGNESIUM CITRATE PO SOLN
1.0000 | ORAL | Status: DC | PRN
  Administered 2012-04-07: 1 via ORAL
  Filled 2012-04-07: qty 296

## 2012-04-07 MED ORDER — OXYCODONE-ACETAMINOPHEN 5-325 MG PO TABS
1.0000 | ORAL_TABLET | Freq: Four times a day (QID) | ORAL | Status: DC | PRN
  Administered 2012-04-07 – 2012-04-11 (×6): 2 via ORAL
  Filled 2012-04-07 (×7): qty 2

## 2012-04-07 MED ORDER — OXYCODONE HCL 5 MG PO TABS
5.0000 mg | ORAL_TABLET | Freq: Four times a day (QID) | ORAL | Status: DC | PRN
  Administered 2012-04-07 – 2012-04-08 (×3): 10 mg via ORAL
  Filled 2012-04-07 (×2): qty 2

## 2012-04-07 NOTE — Progress Notes (Signed)
Physical Therapy Treatment Patient Details Name: [name] [name] MRN: [id number] DOB: [birth year] Today's Date: 04/07/2012 Time: 7829-5621 PT Time Calculation (min): 15 min  PT Assessment / Plan / Recommendation Comments on Treatment Session  Pt requiring increased assist 2* increase in pain level.  Pt would benefit from follow up in rehab setting prior to return home.     Follow Up Recommendations  SNF     Does the patient have the potential to tolerate intense rehabilitation     Barriers to Discharge        Equipment Recommendations  Wheelchair (measurements PT);Wheelchair cushion (measurements PT)    Recommendations for Other Services OT consult  Frequency Min 6X/week   Plan Discharge plan needs to be updated    Precautions / Restrictions Precautions Precautions: Fall Restrictions Weight Bearing Restrictions: Yes RLE Weight Bearing: Non weight bearing LLE Weight Bearing: Non weight bearing   Pertinent Vitals/Pain 8-9/10; PCA, premedicated, RN aware    Mobility  Bed Mobility Bed Mobility: Supine to Sit Supine to Sit: 4: Min assist Details for Bed Mobility Assistance: min assist to come up into long sitting postion, mod assist to move LE"s into bed into postion for posterior transfer into recliner.  Transfers Transfers: Risk manager: 1: +2 Total assist Anterior-Posterior Transfers: Patient Percentage: 60% Details for Transfer Assistance: assist required for Bil LEs and utilized pad to assist pt shifting around and scooting backward to comode seat Ambulation/Gait Ambulation/Gait Assistance: Not tested (comment)    Exercises     PT Diagnosis:    PT Problem List:   PT Treatment Interventions:     PT Goals Acute Rehab PT Goals PT Goal Formulation: With patient Time For Goal Achievement: 04/17/12 Potential to Achieve Goals: Good Pt will Transfer Bed to Chair/Chair to Bed: with min assist PT Transfer Goal: Bed to Chair/Chair  to Bed - Progress: Revised due to lack of progress Pt will Perform Home Exercise Program: with supervision, verbal cues required/provided PT Goal: Perform Home Exercise Program - Progress: Revised due to lack of progress Pt will Propel Wheelchair: 51 - 150 feet;with min assist PT Goal: Propel Wheelchair - Progress: Goal set today  Visit Information  Last PT Received On: 04/07/12 Assistance Needed: +2    Subjective Data  Subjective: I had more surgery yesterday and it hurts but I think I need to use the bathroom Patient Stated Goal: walk again   Cognition  Cognition Overall Cognitive Status: Appears within functional limits for tasks assessed/performed Arousal/Alertness: Awake/alert Orientation Level: Appears intact for tasks assessed Behavior During Session: [facility] for tasks performed    Balance     End of Session PT - End of Session Activity Tolerance: Patient limited by fatigue;Patient limited by pain Patient left: Other (comment) (on [facility]) Nurse Communication: Mobility status   GP     BRADSHAW,HUNTER 04/07/2012, 4:29 PM

## 2012-04-07 NOTE — Progress Notes (Signed)
Physical Therapy Treatment Patient Details Name: [name] [name] MRN: [id number] DOB: February 06, 1960 Today's Date: 04/07/2012 Time: 9562-1308 PT Time Calculation (min): 14 min  PT Assessment / Plan / Recommendation Comments on Treatment Session  Pt requiring increased assist 2* increase in pain level.  Pt would benefit from follow up in rehab setting prior to return home.     Follow Up Recommendations  SNF     Does the patient have the potential to tolerate intense rehabilitation     Barriers to Discharge        Equipment Recommendations  Wheelchair (measurements PT);Wheelchair cushion (measurements PT)    Recommendations for Other Services OT consult  Frequency Min 6X/week   Plan Discharge plan remains appropriate    Precautions / Restrictions Precautions Precautions: Fall Restrictions Weight Bearing Restrictions: Yes RLE Weight Bearing: Non weight bearing LLE Weight Bearing: Non weight bearing   Pertinent Vitals/Pain 9/10; PCA used, premed, RN aware    Mobility  Bed Mobility Bed Mobility: Sit to Supine Supine to Sit: 4: Min assist Sit to Supine: 4: Min assist Details for Bed Mobility Assistance: min assist from long sitting to supine Transfers Transfers: Risk manager: 1: +2 Total assist Anterior-Posterior Transfers: Patient Percentage: 60% Details for Transfer Assistance: assist required for Bil LEs and utilized sheet to assist pt on turning to position in bed Ambulation/Gait Ambulation/Gait Assistance: Not tested (comment)    Exercises     PT Diagnosis:    PT Problem List:   PT Treatment Interventions:     PT Goals Acute Rehab PT Goals PT Goal Formulation: With patient Time For Goal Achievement: 04/17/12 Potential to Achieve Goals: Good Pt will Transfer Bed to Chair/Chair to Bed: with min assist PT Transfer Goal: Bed to Chair/Chair to Bed - Progress: Goal set today Pt will Perform Home Exercise Program: with  supervision, verbal cues required/provided PT Goal: Perform Home Exercise Program - Progress: Goal set today Pt will Propel Wheelchair: 51 - 150 feet;with min assist PT Goal: Propel Wheelchair - Progress: Goal set today  Visit Information  Last PT Received On: 04/07/12 Assistance Needed: +2    Subjective Data  Subjective: I'm ready to get back to bed Patient Stated Goal: walk again   Cognition  Cognition Overall Cognitive Status: Appears within functional limits for tasks assessed/performed Arousal/Alertness: Awake/alert Orientation Level: Appears intact for tasks assessed Behavior During Session: [facility] for tasks performed    Balance     End of Session PT - End of Session Activity Tolerance: Patient limited by fatigue;Patient limited by pain Patient left: in bed;with call bell/phone within reach;with family/visitor present Nurse Communication: Mobility status;Patient requests pain meds   GP     BRADSHAW,HUNTER 04/07/2012, 4:34 PM

## 2012-04-07 NOTE — Progress Notes (Signed)
CARE MANAGEMENT NOTE 04/07/2012  Patient:  [name] [name], [name] [name]   Account Number:  [id number]  Date Initiated:  04/02/2012  Documentation initiated by:  Vance Peper  Subjective/Objective Assessment:   52 yr old male admitted for right tib/fib fracture, had placement of external fixator. Left calcaneous fracture.Patient will have surgery possibly this evening.     Action/Plan:   04/06/12- pt had Left femur ORIF  I & D pinsites, external fixator cleaned   Anticipated DC Date:     Anticipated DC Plan:  SKILLED NURSING FACILITY  In-house referral  Clinical Social Worker      DC Planning Services  CM consult      Choice offered to / List presented to:             Status of service:  In process, will continue to follow Medicare Important Message given?   (If response is "NO", the following Medicare IM given date fields will be blank) Date Medicare IM given:   Date Additional Medicare IM given:    Discharge Disposition:    Per UR Regulation:    If discussed at Long Length of Stay Meetings, dates discussed:    Comments:  04/07/12 10:00 am Vance Peper, RN BSN Case Manager [phone] CM spoke with patient and wife concerning need for possible shortterm rehab at [facility]. They would like to go to facility near their home. Spoke with patient's worker's comp RN CM-Ginny. Informed her of [name] in [place]. CM provided Social Tanya Nones with [employer] for [employer].

## 2012-04-07 NOTE — Op Note (Signed)
NAMESHYHEIM, TANNEY NO.:  [id number]  MEDICAL RECORD NO.:  [id number]  LOCATION:  5N28C                        FACILITY:  [facility]  PHYSICIAN:  Doralee Albino. Carola Frost, M.D. DATE OF BIRTH:  20-Aug-1960  DATE OF PROCEDURE:  04/06/2012 DATE OF DISCHARGE:                              OPERATIVE REPORT   PREOPERATIVE DIAGNOSES: 1. Left distal femur fracture. 2. Left calcaneus fracture. 3. Right bicondylar tibial plateau fracture.  POSTOPERATIVE DIAGNOSES: 1. Left distal femur fracture. 2. Left calcaneus fracture. 3. Right bicondylar tibial plateau fracture.  PROCEDURES: 1. Open reduction and internal fixation of left distal femur. 2. Open reduction and internal fixation of left calcaneus.  SURGEON:  Doralee Albino. Carola Frost, MD  ASSISTANT:  Montez Morita, PA-C  ANESTHESIA:  General.  COMPLICATIONS:  None.  TOURNIQUET:  None.  INS:  1900 mL crystalloid.  OUT:  600 mL urinary output.  DISPOSITION:  PACU.  CONDITION:  Stable.  BRIEF SUMMARY AND INDICATION FOR PROCEDURE:  [name] [name] is a 52 year old male who sustained the above injuries in a 5 feet fall from a ladder.  He underwent spanning and external fixation of the right tibial plateau and presents for definitive internal fixation of all fractures depending on the skin condition.  I did discuss with him the risks and benefits of surgery including the possibility of infection, nerve injury, vessel injury, DVT, PE, heart attack, stroke, wound break down that could lead to a deep infection and limb loss, malunion, nonunion, need for further surgery, arthritis, and multiple others.  The patient did understand and did wish to proceed.  BRIEF DESCRIPTION OF PROCEDURE:  [name] [name] did receive preoperative antibiotics, taken to the operating room where general anesthesia was induced.  His right lower extremity was carefully evaluated and noted to have a large fracture below and soft tissue swelling that precluded  a safe exposure and internal fixation of the tibial plateau.  A fresh dressing was then applied, and Ace wrapped from foot to thigh.  Attention was then turned to the left lower extremity where a standard prep and drape was performed of the left lower leg.  I began at the knee with a lateral approach, identifying the supracondylar exit of the fracture.  This was cleaned with a curette using a laminar spreader and then a pulsatile lavage.  A small stab incision was made on the medial side.  My assistant, Montez Morita pulled traction and applied varus to reduce the fracture, and then I compressed it with a Darrick Penna clamp and a K-wire for the 7.3 cannulated screw set.  This was followed by application of a pelvic recon plate, contoured to buttress the lateral cortex and to function as an anti-glide plate.  This was secured with 2 screws proximal to the exit of the fracture and 1 that was eventually lagged through the plate.  First, the clamp was placed quite distally to compress across the articular surface followed by 3 parallel 7.3 cannulated screws.  These were tightened to compress the fracture site and then final C-arm images obtained.  It showed what appeared to be anatomic restoration of the articular surface and metaphysis with appropriate hardware placement, trajectory and  length.  Wound was closed in standard layered fashion with #1 Vicryl, a 2-0 Vicryl, and a 3-0 nylon.  Sterile gently compressive dressing was applied.  Attention was then turned to the calcaneus where the fracture pattern was deemed appropriate for percutaneous fixation.  A Schanz pin was placed into the tuberosity and plantar force for distraction as well as valgus by control of the hindfoot and forefoot was produced.  This was followed by placement of the guide pins for the 7.3 cannulated screws that were inserted through the tuberosity just inferior to the subtalar facet to support it.  These were checked on  multiple C-arm images showing appropriate trajectory.  An additional subtalar screw in the posterior facet was placed lagging the near cortex and fracture securing in the sustentaculum with excellent compression and good purchase.  This was again checked on fluoro.  My assistant, Montez Morita was able to control the foot and produced a reduction while I placed provisional and definitive internal fixation.  Final images again showed restoration of alignment and appropriate hardware placement.  Wounds were irrigated and closed in standard fashion.  Sterile gently compressive dressing and splint was applied.  The patient was awakened from anesthesia and transported to the PACU in stable condition.  PROGNOSIS:  [name] [name] will continue to be nonweightbearing on the left lower extremity with unrestricted range of motion of the knee, and this will include the heel, foot, and ankle in the next week as his pain and soft tissue swelling subsides.  On the right, we anticipate return for definitive internal fixation again either towards the end of the week or next week as soft tissues allow.  He remains at increased risk for complications given the magnitude of injury and intra-articular extension.  Also, a metabolic bone workup is currently underway with treatment as indicated.     Doralee Albino. Carola Frost, M.D.     MHH/MEDQ  D:  04/06/2012  T:  04/07/2012  Job:  [id number]

## 2012-04-07 NOTE — Progress Notes (Signed)
Orthopedic Tech Progress Note Patient Details:  [name] [name] 09-20-1960 [id number] Called [facility] for brace order talked to [facility]. Patient ID: [name] [name], male   DOB: Jun 19, 1960, 52 y.o.   MRN: [id number]   [name] [name] 04/07/2012, 3:27 PM

## 2012-04-07 NOTE — Progress Notes (Signed)
OT Cancellation Note  Patient Details Name: [name] [name] MRN: [id number] DOB: [birth year]   Cancelled Treatment:     Pt politely declined OT this afternoon. Pt very fatigued, lethargic and stated that he was too worn out. OT will re attempt tomorrow  Galen Manila, OT 04/07/2012, 3:51 PM

## 2012-04-07 NOTE — Clinical Social Work Placement (Addendum)
Clinical Social Work Department  CLINICAL SOCIAL WORK PLACEMENT NOTE  04/07/2012 Patient:[name] [name] Account Number:  [id number] Admit date: 04/01/12 Clinical Social Worker: Sabino Niemann MSW  Date/time: 04/07/2012 11:30 AM  Clinical Social Work is seeking post-discharge placement for this patient at the following level of care: SKILLED NURSING (*CSW will update this form in Epic as items are completed)  04/07/2012 Patient/family provided with [facility] Department of Clinical Social Work's list of facilities offering this level of care within the geographic area requested by the patient (or if unable, by the patient's family).  04/07/2012 Patient/family informed of their freedom to choose among providers that offer the needed level of care, that participate in Medicare, Medicaid or managed care program needed by the patient, have an available bed and are willing to accept the patient.  04/07/2012 Patient/family informed of [facility]' ownership interest in [facility], as well as of the fact that they are under no obligation to receive care at this facility.  PASARR submitted to EDS on 04/07/2012 PASARR number received from EDS on 04/07/2012 FL2 transmitted to all facilities in geographic area requested by pt/family on 04/07/2012 FL2 transmitted to all facilities within larger geographic area on  Patient informed that his/her managed care company has contracts with or will negotiate with certain facilities, including the following:  Patient/family informed of bed offers received:  Patient chooses bed at [facility] Physician recommends and patient chooses bed at  Patient to be transferred to on 04/15/12 Patient to be transferred to facility by [facility] The following physician request were entered in Epic:  Additional Comments:  Sabino Niemann, MSW [phone] Signing off
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
## 2012-04-07 NOTE — Progress Notes (Signed)
Orthopedic Tech Progress Note Patient Details:  [name] [name] 1961/01/03 [id number] Brace order completed by Advanced vendor Thayer Ohm at 4:30 pm. Patient ID: [name], male   DOB: 1960-09-22, 52 y.o.   MRN: [id number]   Jennye Moccasin 04/07/2012, 4:51 PM

## 2012-04-07 NOTE — Clinical Social Work Psychosocial (Addendum)
Clinical Social Work Department  BRIEF PSYCHOSOCIAL ASSESSMENT  Patient: [name] [name]  Account Number: [id number]  Admit date: 04/01/12 Clinical Social Worker Sabino Niemann, MSW Date/Time: 04/07/2012 1130AM Referred by: Physician Date Referred: 04/07/2012 Referred for   SNF Placement   Other Referral:  Interview type: Patient  Other interview type: PSYCHOSOCIAL DATA  Living Status: Spouse Admitted from facility:  Level of care:  Primary support name: [name] [name] Primary support relationship to patient: spouse Degree of support available:  Strong and vested  CURRENT CONCERNS  Current Concerns   Post-Acute Placement   Other Concerns:  SOCIAL WORK ASSESSMENT / PLAN  CSW met with pt re: PT recommendation for SNF.   Pt lives with his spouse in [place]  CSW explained placement process and answered questions.   Pt reports [name] [name]  as his preference  - patient is worker's compensation  CSW completed FL2 and initiated SNF search.     Assessment/plan status: Information/Referral to [facility]  Other assessment/ plan:  Information/referral to community resources:  SNF   PTAR   PATIENT'S/FAMILY'S RESPONSE TO PLAN OF CARE:  Pt  Reports hes agreeable to ST SNF in order to increase strength and independence with mobility prior to return home  Pt verbalized understanding of placement process and appreciation for CSW assist.   Sabino Niemann, MSW [phone]

## 2012-04-08 ENCOUNTER — Encounter (HOSPITAL_COMMUNITY): Payer: Self-pay | Admitting: Orthopedic Surgery

## 2012-04-08 LAB — BASIC METABOLIC PANEL
BUN: 16 mg/dL (ref 6–23)
CO2: 29 mEq/L (ref 19–32)
Calcium: 9.2 mg/dL (ref 8.4–10.5)
Creatinine, Ser: 0.62 mg/dL (ref 0.50–1.35)
Glucose, Bld: 159 mg/dL — ABNORMAL HIGH (ref 70–99)

## 2012-04-08 LAB — VITAMIN D 1,25 DIHYDROXY
Vitamin D 1, 25 (OH)2 Total: 14 pg/mL — ABNORMAL LOW (ref 18–72)
Vitamin D2 1, 25 (OH)2: <8 pg/mL
Vitamin D3 1, 25 (OH)2: 14 pg/mL

## 2012-04-08 LAB — CBC
HCT: 29.8 % — ABNORMAL LOW (ref 39.0–52.0)
MCH: 31.1 pg (ref 26.0–34.0)
MCHC: 33.6 g/dL (ref 30.0–36.0)
MCV: 92.5 fL (ref 78.0–100.0)
Platelets: 408 10*3/uL — ABNORMAL HIGH (ref 150–400)
RDW: 13.1 % (ref 11.5–15.5)
WBC: 9.3 10*3/uL (ref 4.0–10.5)

## 2012-04-08 LAB — FERRITIN: Ferritin: 125 ng/mL (ref 22–322)

## 2012-04-08 NOTE — Evaluation (Signed)
Occupational Therapy Evaluation Patient Details Name: [name] [name] MRN: [id number] DOB: Nov 25, 1960 Today's Date: 04/08/2012 Time: 4098-1191 OT Time Calculation (min): 32 min  OT Assessment / Plan / Recommendation Clinical Impression  Pt demos decline in function with ADLs, strength, balance, safety and activity tolerance following B LE surgery due to multiple LE fractures from falling. Pt would benefit from OT services to address these impairments to maximize level of function and safety    OT Assessment  Patient needs continued OT Services    Follow Up Recommendations  CIR;SNF    Barriers to Discharge Other (comment) Unsure of caregiver support  Equipment Recommendations  3 in 1 bedside comode;Tub/shower bench;Wheelchair cushion (measurements OT);Hospital bed;Other (comment) (TBD at SNF or CIR level of care)    Recommendations for Other Services    Frequency  Min 2X/week    Precautions / Restrictions Precautions Precautions: Fall Required Braces or Orthoses: Other Brace/Splint Other Brace/Splint: Hinged knee brace on LLE  Restrictions Weight Bearing Restrictions: Yes RLE Weight Bearing: Non weight bearing LLE Weight Bearing: Non weight bearing Other Position/Activity Restrictions: external fixator on RLE,    Pertinent Vitals/Pain     ADL  Grooming: Set up Where Assessed - Grooming: Supported sitting Upper Body Bathing: Set up;Simulated Where Assessed - Upper Body Bathing: Supported sitting Lower Body Bathing: Maximal assistance;Simulated Where Assessed - Lower Body Bathing: Lean right and/or left;Supported sitting Upper Body Dressing: +1 Total assistance;Performed Where Assessed - Upper Body Dressing: Supported sitting;Rolling right and/or left Lower Body Dressing: +1 Total assistance Where Assessed - Lower Body Dressing: Lean right and/or left;Supported sitting Toilet Transfer: +2 Total assistance;Simulated Toilet Transfer Method: Anterior-posterior Toileting -  Clothing Manipulation and Hygiene: Maximal assistance Transfers/Ambulation Related to ADLs: ant/post transfer..max A to support BLE ADL Comments: Discussed future DME needs. Pt unsure if he has RW at home. Also dicsussed need for him to have his friend build a ramp. Pt provided with education and demo of ADL A/E    OT Diagnosis: Generalized weakness;Acute pain  OT Problem List: Decreased strength;Decreased range of motion;Decreased activity tolerance;Decreased safety awareness;Decreased knowledge of use of DME or AE;Decreased knowledge of precautions;Obesity;Pain;Increased edema;Impaired sensation OT Treatment Interventions: Self-care/ADL training;Therapeutic exercise;DME and/or AE instruction;Therapeutic activities;Splinting;Patient/family education   OT Goals Acute Rehab OT Goals OT Goal Formulation: With patient Time For Goal Achievement: 04/15/12 Potential to Achieve Goals: Good ADL Goals Pt Will Perform Lower Body Bathing: with caregiver independent in assisting;with min assist;Supine, rolling right and/or left;Unsupported;with cueing (comment type and amount);with adaptive equipment Pt Will Perform Lower Body Dressing: with mod assist;with caregiver independent in assisting;Supine, rolling right and/or left;Supported;with adaptive equipment Pt Will Transfer to Toilet: with min assist;with DME;3-in-1;Maintaining weight bearing status Pt Will Perform Toileting - Clothing Manipulation: with min assist;Sitting on 3-in-1 or toilet;with cueing (comment type and amount);with caregiver independent in assisting Pt Will Perform Toileting - Hygiene: with supervision;Sitting on 3-in-1 or toilet;Leaning right and/or left on 3-in-1/toilet  Visit Information  Last OT Received On: 04/08/12 PT/OT Co-Evaluation/Treatment: Yes    Subjective Data  Subjective: " I feel a little better today, but I had a rough morning earlier " Patient Stated Goal: To go to rehab, then return home   Prior  Functioning     Home Living Lives With: Spouse Available Help at Discharge: Available PRN/intermittently;Other (Comment) Type of Home: House Home Access: Stairs to enter [employer] of Steps: 2 Entrance Stairs-Rails: None Home Layout: One level Bathroom Shower/Tub: Tub/shower unit;Door [employer] Toilet: Standard Bathroom Accessibility: Yes How Accessible: Accessible via wheelchair  Home Adaptive Equipment: None Prior Function Level of Independence: Independent Able to Take Stairs?: Yes Driving: Yes Vocation: Full time employment Communication Communication: No difficulties Dominant Hand: Right         Vision/Perception Vision - History Baseline Vision: No visual deficits Patient Visual Report: No change from baseline Perception Perception: Within Functional Limits   Cognition  Cognition Overall Cognitive Status: Appears within functional limits for tasks assessed/performed Arousal/Alertness: Awake/alert Orientation Level: Appears intact for tasks assessed Behavior During Session: [facility] for tasks performed    Extremity/Trunk Assessment Right Upper Extremity Assessment RUE ROM/Strength/Tone: [facility] for tasks assessed Left Upper Extremity Assessment LUE ROM/Strength/Tone: WFL for tasks assessed     Mobility Bed Mobility Bed Mobility: Supine to Sit Supine to Sit: 3: Mod assist Sitting - Scoot to Edge of Bed: 3: Mod assist Details for Bed Mobility Assistance: Mod assist for long sitting using Trapeze and bed rail. Manual facilitation to manage bilateral LEs . Pt also required assistance to pull himself into siitting secondary to core weakness.   Transfers Details for Transfer Assistance: Step by step cueing for hand placement and technique..  Assist to manage bilateral LEs while pt sliding back to the chair, and forward into the bed.  2 person assist to manage bilateral LEs to raise Leg rest of the recliner.  .       Exercise     Balance Balance Balance  Assessed: No   End of Session OT - End of Session Equipment Utilized During Treatment:  (ADL A/E) Activity Tolerance: Patient tolerated treatment well Patient left: in chair;with call bell/phone within reach Nurse Communication: Mobility status;Precautions;Weight bearing status  GO     Galen Manila 04/08/2012, 2:04 PM
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
## 2012-04-08 NOTE — Progress Notes (Signed)
Physical Therapy Treatment Patient Details Name: [name] [name] MRN: [id number] DOB: 02-15-1960 Today's Date: 04/08/2012 Time: 0865-7846 PT Time Calculation (min): 17 min  PT Assessment / Plan / Recommendation Comments on Treatment Session  Pt performed Anterior <--> Posterior transfer from bed to chair to bed to chair for practice.  Pt ability to manage Upper body improving. PT will focus on pt being able to manage LLE and perform transfer with 1 person assist for RLE.      Follow Up Recommendations  CIR     Does the patient have the potential to tolerate intense rehabilitation     Barriers to Discharge        Equipment Recommendations  Wheelchair (measurements PT);Wheelchair cushion (measurements PT)    Recommendations for Other Services OT consult  Frequency Min 6X/week   Plan Discharge plan needs to be updated;Frequency needs to be updated    Precautions / Restrictions Precautions Precautions: Fall Required Braces or Orthoses: Other Brace/Splint Other Brace/Splint: Hinged knee brace on LLE  Restrictions Weight Bearing Restrictions: Yes RLE Weight Bearing: Non weight bearing LLE Weight Bearing: Non weight bearing Other Position/Activity Restrictions: external fixator on RLE,    Pertinent Vitals/Pain 10/10 pain in bilateral LEs.  RN notified.   Positioned bilateral LEs for comfort.      Mobility  Bed Mobility Bed Mobility: Supine to Sit Supine to Sit: 3: Mod assist Sitting - Scoot to Edge of Bed: 3: Mod assist Sitting - Scoot to Edge of Bed: Patient Percentage: 60% Sit to Supine: Not Tested (comment) Details for Bed Mobility Assistance: Mod assist for long sitting using Trapeze and bed rail. Manual facilitation to manage bilateral LEs . Pt also required assistance to pull himself into siitting secondary to core weakness.   Transfers Transfers: Risk manager: 1: +2 Total assist Anterior-Posterior Transfers: Patient Percentage:  70% Details for Transfer Assistance: Step by step cueing for hand placement and technique..  Assist to manage bilateral LEs while pt sliding back to the chair, and forward into the bed.  2 person assist to manage bilateral LEs to raise Leg rest of the recliner.  .   Ambulation/Gait Ambulation/Gait Assistance: Not tested (comment) Wheelchair Mobility Wheelchair Mobility: No    Exercises     PT Diagnosis:    PT Problem List:   PT Treatment Interventions:     PT Goals Acute Rehab PT Goals PT Goal Formulation: With patient Time For Goal Achievement: 04/17/12 Potential to Achieve Goals: Good Pt will Transfer Bed to Chair/Chair to Bed: with min assist PT Transfer Goal: Bed to Chair/Chair to Bed - Progress: Progressing toward goal Pt will Perform Home Exercise Program: with supervision, verbal cues required/provided Pt will Propel Wheelchair: 51 - 150 feet;with min assist  Visit Information  Last PT Received On: 04/08/12 PT/OT Co-Evaluation/Treatment: Yes    Subjective Data  Subjective: I can give it a shot Patient Stated Goal: walk again   Cognition  Cognition Overall Cognitive Status: Appears within functional limits for tasks assessed/performed Arousal/Alertness: Awake/alert Orientation Level: Appears intact for tasks assessed Behavior During Session: [facility] for tasks performed    Balance     End of Session PT - End of Session Activity Tolerance: Patient tolerated treatment well Patient left: in chair;with call bell/phone within reach Nurse Communication: Mobility status;Patient requests pain meds   GP     [place]wood,PETER 04/08/2012, 10:46 AM Theron Arista L. Oros DPT [phone]

## 2012-04-08 NOTE — Progress Notes (Addendum)
Rehab Admissions Coordinator Note:  Patient was screened by Clois Dupes for appropriateness for an Inpatient Acute Rehab Consult.  At this time, we are recommending Skilled Nursing Facility. Do not feel pt at a level to tolerate intense IP rehab therapies and is slide board transfers with BLE NWB status.  Clois Dupes 04/08/2012, 10:12 AM  I can be reached at [phone].  PT and OT both feel pt a good candidate for CIR. Please order IP rehab consult and our physiatrist will assess candidate. [phone]

## 2012-04-08 NOTE — Plan of Care (Signed)
Problem: Phase II Progression Outcomes Goal: Discharge plan established OT/PT feel pt good candidate for CIR, recommend SNF for further therapy if unable to go to CIR

## 2012-04-09 LAB — CBC
HCT: 29.6 % — ABNORMAL LOW (ref 39.0–52.0)
Hemoglobin: 9.9 g/dL — ABNORMAL LOW (ref 13.0–17.0)
MCV: 92.5 fL (ref 78.0–100.0)
RBC: 3.2 MIL/uL — ABNORMAL LOW (ref 4.22–5.81)
WBC: 9.3 10*3/uL (ref 4.0–10.5)

## 2012-04-09 LAB — TESTOSTERONE: Testosterone: 35 ng/dL — ABNORMAL LOW (ref 241–827)

## 2012-04-09 MED ORDER — PANTOPRAZOLE SODIUM 40 MG PO TBEC
40.0000 mg | DELAYED_RELEASE_TABLET | Freq: Every day | ORAL | Status: DC
  Administered 2012-04-09 – 2012-04-12 (×3): 40 mg via ORAL
  Filled 2012-04-09 (×5): qty 1
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
## 2012-04-10 MED ORDER — ALUM & MAG HYDROXIDE-SIMETH 200-200-20 MG/5ML PO SUSP: 30.0000 mL | Freq: Four times a day (QID) | ORAL | Status: DC | PRN

## 2012-04-10 NOTE — Progress Notes (Signed)
Physical Therapy Treatment Patient Details Name: [name] [name] MRN: [id number] DOB: 10-29-1960 Today's Date: 04/10/2012 Time: 4098-1191 (with 12 minute break on BSC) PT Time Calculation (min): 37 min  PT Assessment / Plan / Recommendation Comments on Treatment Session  Making improvements with transfers.      Follow Up Recommendations  CIR     Does the patient have the potential to tolerate intense rehabilitation     Barriers to Discharge        Equipment Recommendations  Wheelchair (measurements PT);Wheelchair cushion (measurements PT)    Recommendations for Other Services    Frequency Min 6X/week   Plan Discharge plan remains appropriate;Frequency remains appropriate    Precautions / Restrictions Precautions Precautions: Fall Required Braces or Orthoses: Other Brace/Splint Other Brace/Splint: Hinged knee brace on LLE , x-fixator on right leg Restrictions Weight Bearing Restrictions: Yes RLE Weight Bearing: Non weight bearing LLE Weight Bearing: Non weight bearing   Pertinent Vitals/Pain Pain impacting mobility    Mobility  Bed Mobility Bed Mobility: Supine to Sit;Sitting - Scoot to Edge of Bed Supine to Sit: 5: Supervision;With rails;HOB elevated (with trapeze) Sitting - Scoot to Edge of Bed: 3: Mod assist Details for Bed Mobility Assistance: Patient using rails/trapeze to move upper body in bed to sitting position.  Assist to move bil. LE's on bed. Transfers Transfers: Risk manager: 3: Mod assist Details for Transfer Assistance: Verbal cues for technique getting to [facility].  Patient using rail and armrests of BSC to lift trunk.  Assist to move bil. LE's.  Patient then transferred [facility] -> bed, assist to move bil. LE's.  After rest break, patient transferred posteriorly into recliner chair.  Assist to bring bil. LE's off of bed and raise footrest of chair. Ambulation/Gait Ambulation/Gait Assistance: Not tested (comment)      PT  Goals Acute Rehab PT Goals PT Transfer Goal: Bed to Chair/Chair to Bed - Progress: Progressing toward goal  Visit Information  Last PT Received On: 04/10/12 Assistance Needed: +1    Subjective Data  Subjective: "I want to sit on the [facility] for a while."   Cognition  Cognition Overall Cognitive Status: Appears within functional limits for tasks assessed/performed Arousal/Alertness: Awake/alert Orientation Level: Appears intact for tasks assessed Behavior During Session: [facility] for tasks performed    Balance     End of Session PT - End of Session Equipment Utilized During Treatment: Gait belt Activity Tolerance: Patient limited by fatigue;Patient limited by pain Patient left: in chair;with call bell/phone within reach Nurse Communication: Mobility status (Request for bed change and bath)   GP     Vena Austria 04/10/2012, 1:44 PM Durenda Hurt. Renaldo Fiddler, [facility] Acute Rehab Services Pager [phone]
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
## 2012-04-11 NOTE — Progress Notes (Signed)
Pt refused 1800 methocarbamol and ferrous sulfate.  Stated "My stomach hurts and I just don't want to take anything else right now.  The pain medication is making my stomach upset."  Offered pt medication to help, but pt refused.  Pt refused ginger ale.  Requested to be left alone and stated "If I need any pain medication for my legs, I'll ask for it."  Will continue to monitor.

## 2012-04-11 NOTE — Progress Notes (Signed)
Seen in room 5N28 this am  Vital signs stable, afebrile.  S: Bored, wants to be allowed to be wheeled off floor in a wheelchair by his wife - not to smoke  A: supine in bed with thigh to shin ex fix on right LE and knee immobilizer on the left LE  P: awaiting surgery on Tuesday, 3 days, may go for a wheel chair ride

## 2012-04-12 MED ORDER — HYDROCODONE-ACETAMINOPHEN 5-325 MG PO TABS
1.0000 | ORAL_TABLET | ORAL | Status: DC | PRN
  Administered 2012-04-12 – 2012-04-13 (×3): 2 via ORAL
  Filled 2012-04-12 (×3): qty 2

## 2012-04-12 NOTE — Progress Notes (Signed)
Seen in room 5N28  Vital signs stable afebril  S: no complaints  O: comfortable   A: stable  P: awaits ORIF

## 2012-04-13 MED ORDER — CEFAZOLIN SODIUM-DEXTROSE 2-3 GM-% IV SOLR
2.0000 g | Freq: Once | INTRAVENOUS | Status: AC
  Administered 2012-04-14 (×2): 2 g via INTRAVENOUS
  Filled 2012-04-13: qty 50

## 2012-04-13 MED ORDER — LACTATED RINGERS IV SOLN
INTRAVENOUS | Status: DC
  Administered 2012-04-14: 03:00:00 via INTRAVENOUS
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
## 2012-04-13 NOTE — Progress Notes (Signed)
PT Cancellation Note  Patient Details Name: [name] [name] MRN: [id number] DOB: February 22, 1960   Cancelled Treatment:    Reason Eval/Treat Not Completed: Fatigue/lethargy limiting ability to participate (pt just got up to chair with Nsg.)  Will f/u another time.     Sunny Schlein, [place] [phone] 04/13/2012, 1:56 PM

## 2012-04-14 ENCOUNTER — Encounter (HOSPITAL_COMMUNITY): Payer: Self-pay | Admitting: Certified Registered"

## 2012-04-14 ENCOUNTER — Inpatient Hospital Stay (HOSPITAL_COMMUNITY): Payer: Worker's Compensation

## 2012-04-14 ENCOUNTER — Inpatient Hospital Stay (HOSPITAL_COMMUNITY): Payer: Worker's Compensation | Admitting: Anesthesiology

## 2012-04-14 ENCOUNTER — Encounter (HOSPITAL_COMMUNITY): Payer: Self-pay | Admitting: Anesthesiology

## 2012-04-14 ENCOUNTER — Encounter (HOSPITAL_COMMUNITY)
Admission: EM | Disposition: A | Discharge status: Skilled Nursing Facility | Payer: Self-pay | Source: Home / Self Care | Attending: Orthopedic Surgery

## 2012-04-14 HISTORY — PX: ORIF TIBIA PLATEAU: SHX2132

## 2012-04-14 LAB — BASIC METABOLIC PANEL
BUN: 17 mg/dL (ref 6–23)
Chloride: 100 mEq/L (ref 96–112)
GFR calc Af Amer: >90 mL/min (ref >90)
GFR calc non Af Amer: >90 mL/min (ref >90)
Potassium: 4.1 mEq/L (ref 3.5–5.1)

## 2012-04-14 LAB — CBC
HCT: 35.1 % — ABNORMAL LOW (ref 39.0–52.0)
MCHC: 33.6 g/dL (ref 30.0–36.0)
Platelets: 693 10*3/uL — ABNORMAL HIGH (ref 150–400)
RDW: 13.9 % (ref 11.5–15.5)
WBC: 9.5 10*3/uL (ref 4.0–10.5)

## 2012-04-14 LAB — TYPE AND SCREEN: ABO/RH(D): A POS

## 2012-04-14 LAB — ABO/RH: ABO/RH(D): A POS

## 2012-04-14 SURGERY — OPEN REDUCTION INTERNAL FIXATION (ORIF) TIBIAL PLATEAU
Anesthesia: General | Site: Leg Lower | Laterality: Right | Wound class: Clean

## 2012-04-14 MED ORDER — METOCLOPRAMIDE HCL 10 MG PO TABS: 5.0000 mg | ORAL_TABLET | Freq: Three times a day (TID) | ORAL | Status: DC | PRN

## 2012-04-14 MED ORDER — PROPOFOL 10 MG/ML IV BOLUS
INTRAVENOUS | Status: DC | PRN
  Administered 2012-04-14: 180 mg via INTRAVENOUS

## 2012-04-14 MED ORDER — POTASSIUM CHLORIDE IN NACL 20-0.9 MEQ/L-% IV SOLN
INTRAVENOUS | Status: DC
  Filled 2012-04-14 (×3): qty 1000

## 2012-04-14 MED ORDER — METOCLOPRAMIDE HCL 5 MG/ML IJ SOLN: 5.0000 mg | Freq: Three times a day (TID) | INTRAMUSCULAR | Status: DC | PRN

## 2012-04-14 MED ORDER — NALOXONE HCL 0.4 MG/ML IJ SOLN: 0.4000 mg | INTRAMUSCULAR | Status: DC | PRN

## 2012-04-14 MED ORDER — GLYCOPYRROLATE 0.2 MG/ML IJ SOLN
INTRAMUSCULAR | Status: DC | PRN
  Administered 2012-04-14: 0.4 mg via INTRAVENOUS

## 2012-04-14 MED ORDER — CEFAZOLIN SODIUM 1-5 GM-% IV SOLN
INTRAVENOUS | Status: AC
  Filled 2012-04-14: qty 100

## 2012-04-14 MED ORDER — MORPHINE SULFATE (PF) 1 MG/ML IV SOLN
INTRAVENOUS | Status: DC
  Administered 2012-04-14 – 2012-04-15 (×2): via INTRAVENOUS
  Administered 2012-04-15: 19 mg via INTRAVENOUS
  Administered 2012-04-15: 03:00:00 via INTRAVENOUS
  Filled 2012-04-14 (×3): qty 25

## 2012-04-14 MED ORDER — LACTATED RINGERS IV SOLN
INTRAVENOUS | Status: DC | PRN
  Administered 2012-04-14 (×3): via INTRAVENOUS

## 2012-04-14 MED ORDER — ONDANSETRON HCL 4 MG PO TABS: 4.0000 mg | ORAL_TABLET | Freq: Four times a day (QID) | ORAL | Status: DC | PRN

## 2012-04-14 MED ORDER — WARFARIN VIDEO: Freq: Once | Status: DC

## 2012-04-14 MED ORDER — ONDANSETRON HCL 4 MG/2ML IJ SOLN
INTRAMUSCULAR | Status: DC | PRN
  Administered 2012-04-14: 4 mg via INTRAVENOUS

## 2012-04-14 MED ORDER — ONDANSETRON HCL 4 MG/2ML IJ SOLN: 4.0000 mg | Freq: Once | INTRAMUSCULAR | Status: DC | PRN

## 2012-04-14 MED ORDER — HYDROMORPHONE HCL PF 1 MG/ML IJ SOLN
INTRAMUSCULAR | Status: DC | PRN
  Administered 2012-04-14 (×2): 0.5 mg via INTRAVENOUS

## 2012-04-14 MED ORDER — MEPERIDINE HCL 25 MG/ML IJ SOLN: 6.2500 mg | INTRAMUSCULAR | Status: DC | PRN

## 2012-04-14 MED ORDER — MORPHINE SULFATE (PF) 1 MG/ML IV SOLN
INTRAVENOUS | Status: AC
  Administered 2012-04-14: 16:00:00
  Filled 2012-04-14: qty 25

## 2012-04-14 MED ORDER — NEOSTIGMINE METHYLSULFATE 1 MG/ML IJ SOLN
INTRAMUSCULAR | Status: DC | PRN
  Administered 2012-04-14: 3 mg via INTRAVENOUS

## 2012-04-14 MED ORDER — HYDROMORPHONE HCL PF 1 MG/ML IJ SOLN
INTRAMUSCULAR | Status: AC
  Filled 2012-04-14: qty 1

## 2012-04-14 MED ORDER — DIPHENHYDRAMINE HCL 50 MG/ML IJ SOLN: 12.5000 mg | Freq: Four times a day (QID) | INTRAMUSCULAR | Status: DC | PRN

## 2012-04-14 MED ORDER — DIPHENHYDRAMINE HCL 12.5 MG/5ML PO ELIX: 12.5000 mg | ORAL_SOLUTION | Freq: Four times a day (QID) | ORAL | Status: DC | PRN

## 2012-04-14 MED ORDER — OXYCODONE HCL 5 MG PO TABS: 5.0000 mg | ORAL_TABLET | Freq: Once | ORAL | Status: DC | PRN

## 2012-04-14 MED ORDER — OXYCODONE HCL 5 MG/5ML PO SOLN: 5.0000 mg | Freq: Once | ORAL | Status: DC | PRN

## 2012-04-14 MED ORDER — WARFARIN - PHARMACIST DOSING INPATIENT: Freq: Every day | Status: DC

## 2012-04-14 MED ORDER — FENTANYL CITRATE 0.05 MG/ML IJ SOLN
INTRAMUSCULAR | Status: DC | PRN
  Administered 2012-04-14: 100 ug via INTRAVENOUS
  Administered 2012-04-14 (×3): 25 ug via INTRAVENOUS
  Administered 2012-04-14: 50 ug via INTRAVENOUS
  Administered 2012-04-14: 25 ug via INTRAVENOUS
  Administered 2012-04-14 (×3): 50 ug via INTRAVENOUS
  Administered 2012-04-14: 100 ug via INTRAVENOUS

## 2012-04-14 MED ORDER — ONDANSETRON HCL 4 MG/2ML IJ SOLN: 4.0000 mg | Freq: Four times a day (QID) | INTRAMUSCULAR | Status: DC | PRN

## 2012-04-14 MED ORDER — DIPHENHYDRAMINE HCL 12.5 MG/5ML PO ELIX: 12.5000 mg | ORAL_SOLUTION | ORAL | Status: DC | PRN

## 2012-04-14 MED ORDER — ENOXAPARIN SODIUM 40 MG/0.4ML ~~LOC~~ SOLN
40.0000 mg | SUBCUTANEOUS | Status: DC
  Administered 2012-04-15: 40 mg via SUBCUTANEOUS
  Filled 2012-04-14 (×2): qty 0.4

## 2012-04-14 MED ORDER — OXYCODONE HCL 5 MG PO TABS: 5.0000 mg | ORAL_TABLET | ORAL | Status: DC | PRN

## 2012-04-14 MED ORDER — LIDOCAINE HCL (CARDIAC) 20 MG/ML IV SOLN
INTRAVENOUS | Status: DC | PRN
  Administered 2012-04-14: 60 mg via INTRAVENOUS

## 2012-04-14 MED ORDER — SODIUM CHLORIDE 0.9 % IJ SOLN: 9.0000 mL | INTRAMUSCULAR | Status: DC | PRN

## 2012-04-14 MED ORDER — CEFAZOLIN SODIUM 1-5 GM-% IV SOLN
1.0000 g | Freq: Three times a day (TID) | INTRAVENOUS | Status: AC
Start: 2012-04-14 — End: 2012-04-15
  Administered 2012-04-14 – 2012-04-15 (×3): 1 g via INTRAVENOUS
  Filled 2012-04-14 (×3): qty 50

## 2012-04-14 MED ORDER — WARFARIN SODIUM 7.5 MG PO TABS
7.5000 mg | ORAL_TABLET | Freq: Once | ORAL | Status: AC
  Administered 2012-04-14: 7.5 mg via ORAL
  Filled 2012-04-14: qty 1

## 2012-04-14 MED ORDER — HYDROMORPHONE HCL PF 1 MG/ML IJ SOLN
0.2500 mg | INTRAMUSCULAR | Status: DC | PRN
  Administered 2012-04-14 (×2): 0.5 mg via INTRAVENOUS

## 2012-04-14 MED ORDER — 0.9 % SODIUM CHLORIDE (POUR BTL) OPTIME
TOPICAL | Status: DC | PRN
  Administered 2012-04-14: 1000 mL

## 2012-04-14 MED ORDER — ROCURONIUM BROMIDE 100 MG/10ML IV SOLN
INTRAVENOUS | Status: DC | PRN
  Administered 2012-04-14: 50 mg via INTRAVENOUS
  Administered 2012-04-14 (×2): 20 mg via INTRAVENOUS
  Administered 2012-04-14: 50 mg via INTRAVENOUS
  Administered 2012-04-14: 20 mg via INTRAVENOUS
  Administered 2012-04-14 (×3): 10 mg via INTRAVENOUS

## 2012-04-14 MED ORDER — COUMADIN BOOK
Freq: Once | Status: AC
  Administered 2012-04-15: 09:00:00
  Filled 2012-04-14: qty 1

## 2012-04-14 SURGICAL SUPPLY — 104 items
BANDAGE ELASTIC 4 VELCRO ST LF (GAUZE/BANDAGES/DRESSINGS) ×3 IMPLANT
BANDAGE ELASTIC 6 VELCRO ST LF (GAUZE/BANDAGES/DRESSINGS) ×3 IMPLANT
BANDAGE ESMARK 6X9 LF (GAUZE/BANDAGES/DRESSINGS) ×1 IMPLANT
BANDAGE GAUZE ELAST BULKY 4 IN (GAUZE/BANDAGES/DRESSINGS) ×2 IMPLANT
BIT DRILL 100X2.5XANTM LCK (BIT) IMPLANT
BIT DRILL 3.5X5.5 QC CALB (BIT) ×1 IMPLANT
BIT DRILL CAL (BIT) IMPLANT
BIT DRL 100X2.5XANTM LCK (BIT) ×1
BLADE SURG 10 STRL SS (BLADE) ×2 IMPLANT
BLADE SURG 15 STRL LF DISP TIS (BLADE) ×1 IMPLANT
BLADE SURG 15 STRL SS (BLADE) ×2
BLADE SURG ROTATE 9660 (MISCELLANEOUS) IMPLANT
BNDG CMPR 9X6 STRL LF SNTH (GAUZE/BANDAGES/DRESSINGS) ×1
BNDG COHESIVE 4X5 TAN STRL (GAUZE/BANDAGES/DRESSINGS) ×2 IMPLANT
BNDG ESMARK 6X9 LF (GAUZE/BANDAGES/DRESSINGS) ×2
BONE CANC CHIPS 40CC CAN1/2 (Bone Implant) ×2 IMPLANT
BRUSH SCRUB DISP (MISCELLANEOUS) ×4 IMPLANT
CHIPS CANC BONE 40CC CAN1/2 (Bone Implant) ×1 IMPLANT
CLOTH BEACON ORANGE TIMEOUT ST (SAFETY) ×2 IMPLANT
COVER MAYO STAND STRL (DRAPES) ×2 IMPLANT
DRAPE C-ARM 42X72 X-RAY (DRAPES) ×2 IMPLANT
DRAPE C-ARMOR (DRAPES) ×2 IMPLANT
DRAPE INCISE IOBAN 66X45 STRL (DRAPES) ×2 IMPLANT
DRAPE ORTHO SPLIT 77X108 STRL (DRAPES)
DRAPE SURG ORHT 6 SPLT 77X108 (DRAPES) IMPLANT
DRAPE U-SHAPE 47X51 STRL (DRAPES) ×2 IMPLANT
DRILL BIT 2.5MM (BIT) ×2
DRILL BIT CAL (BIT) ×2
DRSG ADAPTIC 3X8 NADH LF (GAUZE/BANDAGES/DRESSINGS) ×2 IMPLANT
DRSG MEPITEL 4X7.2 (GAUZE/BANDAGES/DRESSINGS) ×2 IMPLANT
DRSG PAD ABDOMINAL 8X10 ST (GAUZE/BANDAGES/DRESSINGS) ×8 IMPLANT
ELECT REM PT RETURN 9FT ADLT (ELECTROSURGICAL) ×2
ELECTRODE REM PT RTRN 9FT ADLT (ELECTROSURGICAL) ×1 IMPLANT
EVACUATOR 1/8 PVC DRAIN (DRAIN) IMPLANT
EVACUATOR 3/16  PVC DRAIN (DRAIN)
EVACUATOR 3/16 PVC DRAIN (DRAIN) IMPLANT
GLOVE BIO SURGEON STRL SZ7.5 (GLOVE) ×2 IMPLANT
GLOVE BIO SURGEON STRL SZ8 (GLOVE) ×2 IMPLANT
GLOVE BIOGEL PI IND STRL 7.5 (GLOVE) ×1 IMPLANT
GLOVE BIOGEL PI IND STRL 8 (GLOVE) ×1 IMPLANT
GLOVE BIOGEL PI INDICATOR 7.5 (GLOVE) ×1
GLOVE BIOGEL PI INDICATOR 8 (GLOVE) ×1
GOWN PREVENTION PLUS XLARGE (GOWN DISPOSABLE) ×2 IMPLANT
GOWN STRL NON-REIN LRG LVL3 (GOWN DISPOSABLE) ×4 IMPLANT
GRAFT BNE CHIP CANC 1-8 40 (Bone Implant) IMPLANT
IMMOBILIZER KNEE 22 UNIV (SOFTGOODS) ×2 IMPLANT
K-WIRE ACE 1.6X6 (WIRE) ×16
KIT BASIN OR (CUSTOM PROCEDURE TRAY) ×2 IMPLANT
KIT ROOM TURNOVER OR (KITS) ×2 IMPLANT
KWIRE ACE 1.6X6 (WIRE) IMPLANT
MANIFOLD NEPTUNE II (INSTRUMENTS) ×2 IMPLANT
NDL SUT 6 .5 CRC .975X.05 MAYO (NEEDLE) IMPLANT
NEEDLE 22X1 1/2 (OR ONLY) (NEEDLE) IMPLANT
NEEDLE MAYO TAPER (NEEDLE)
NS IRRIG 1000ML POUR BTL (IV SOLUTION) ×2 IMPLANT
PACK ORTHO EXTREMITY (CUSTOM PROCEDURE TRAY) ×2 IMPLANT
PAD ARMBOARD 7.5X6 YLW CONV (MISCELLANEOUS) ×4 IMPLANT
PAD CAST 4YDX4 CTTN HI CHSV (CAST SUPPLIES) ×1 IMPLANT
PADDING CAST COTTON 4X4 STRL (CAST SUPPLIES) ×4
PADDING CAST COTTON 6X4 STRL (CAST SUPPLIES) ×3 IMPLANT
PENCIL BUTTON HOLSTER BLD 10FT (ELECTRODE) ×1 IMPLANT
PLATE LOCK 7H 92 BILAT FIB (Plate) ×1 IMPLANT
PLATE LOCK 9H STD LT PROX TIB (Plate) ×1 IMPLANT
RETRIEVER SUT HEWSON (MISCELLANEOUS) ×1 IMPLANT
SCREW CORT FT 32X3.5XNONLOCK (Screw) IMPLANT
SCREW CORTICAL 3.5MM  30MM (Screw) ×2 IMPLANT
SCREW CORTICAL 3.5MM  32MM (Screw) ×1 IMPLANT
SCREW CORTICAL 3.5MM 30MM (Screw) IMPLANT
SCREW CORTICAL 3.5MM 32MM (Screw) ×1 IMPLANT
SCREW CORTICAL 3.5MM 36MM (Screw) ×1 IMPLANT
SCREW CORTICAL 3.5MM 38MM (Screw) ×1 IMPLANT
SCREW CORTICAL 3.5MM 70MM (Screw) ×1 IMPLANT
SCREW LOCK 3.5X50 DIST TIB (Screw) ×1 IMPLANT
SCREW LOCK CORT STAR 3.5X26 (Screw) ×1 IMPLANT
SCREW LOCK CORT STAR 3.5X70 (Screw) ×4 IMPLANT
SCREW LOCK CORT STAR 3.5X75 (Screw) ×4 IMPLANT
SCREW LOCK CORT STAR 3.5X80 (Screw) ×1 IMPLANT
SCREW LP 3.5 (Screw) ×1 IMPLANT
SCREW NL LP 36X3.5 (Screw) ×1 IMPLANT
SPONGE GAUZE 4X4 12PLY (GAUZE/BANDAGES/DRESSINGS) ×3 IMPLANT
SPONGE LAP 18X18 X RAY DECT (DISPOSABLE) ×2 IMPLANT
STAPLER VISISTAT 35W (STAPLE) ×2 IMPLANT
STOCKINETTE IMPERVIOUS LG (DRAPES) ×2 IMPLANT
SUCTION FRAZIER TIP 10 FR DISP (SUCTIONS) ×2 IMPLANT
SUT ETHILON 3 0 PS 1 (SUTURE) ×4 IMPLANT
SUT FIBERWIRE #2 38 REV NDL BL (SUTURE) ×2
SUT FIBERWIRE #5 38 CONV NDL (SUTURE) ×4
SUT PROLENE 0 CT 2 (SUTURE) ×4 IMPLANT
SUT VIC AB 0 CT1 27 (SUTURE) ×6
SUT VIC AB 0 CT1 27XBRD ANBCTR (SUTURE) ×1 IMPLANT
SUT VIC AB 1 CT1 27 (SUTURE) ×2
SUT VIC AB 1 CT1 27XBRD ANBCTR (SUTURE) ×1 IMPLANT
SUT VIC AB 2-0 CT1 27 (SUTURE) ×8
SUT VIC AB 2-0 CT1 TAPERPNT 27 (SUTURE) ×2 IMPLANT
SUTURE FIBERWR #5 38 CONV NDL (SUTURE) IMPLANT
SUTURE FIBERWR#2 38 REV NDL BL (SUTURE) IMPLANT
SYR 20ML ECCENTRIC (SYRINGE) IMPLANT
TOWEL OR 17X24 6PK STRL BLUE (TOWEL DISPOSABLE) ×2 IMPLANT
TOWEL OR 17X26 10 PK STRL BLUE (TOWEL DISPOSABLE) ×4 IMPLANT
TRAY FOLEY CATH 14FR (SET/KITS/TRAYS/PACK) IMPLANT
TUBE CONNECTING 12X1/4 (SUCTIONS) ×2 IMPLANT
WATER STERILE IRR 1000ML POUR (IV SOLUTION) ×4 IMPLANT
YANKAUER SUCT BULB TIP NO VENT (SUCTIONS) ×2 IMPLANT
cup washer (Washer) ×1 IMPLANT

## 2012-04-14 NOTE — Anesthesia Preprocedure Evaluation (Addendum)
Anesthesia Evaluation  Patient identified by MRN, date of birth, ID band Patient awake    Reviewed: Allergy & Precautions, H&P , NPO status , Patient's Chart, lab work & pertinent test results  History of Anesthesia Complications Negative for: history of anesthetic complications  Airway Mallampati: II  Neck ROM: full    Dental  (+) Dental Advisory Given   Pulmonary Current Smoker,          Cardiovascular     Neuro/Psych    GI/Hepatic   Endo/Other  obese  Renal/GU      Musculoskeletal   Abdominal   Peds  Hematology   Anesthesia Other Findings   Reproductive/Obstetrics                          Anesthesia Physical Anesthesia Plan  ASA: II  Anesthesia Plan: General   Post-op Pain Management:    Induction: Intravenous  Airway Management Planned: Oral ETT  Additional Equipment:   Intra-op Plan:   Post-operative Plan: Extubation in OR  Informed Consent: I have reviewed the patients History and Physical, chart, labs and discussed the procedure including the risks, benefits and alternatives for the proposed anesthesia with the patient or authorized representative who has indicated his/her understanding and acceptance.     Plan Discussed with: CRNA, Surgeon and Anesthesiologist  Anesthesia Plan Comments:        Anesthesia Quick Evaluation

## 2012-04-14 NOTE — Progress Notes (Signed)
Orthopedic Tech Progress Note Patient Details:  [name] [name] [birth year] [id number] Cam Walker ordered and delivered to main OR desk Ortho Devices Type of Ortho Device: CAM walker Ortho Device/Splint Location: BILATERAL KNEE IMMOBILIZERS Ortho Device/Splint Interventions: Ordered   [place] 04/14/2012, 12:59 PM

## 2012-04-14 NOTE — Progress Notes (Signed)
Orthopedic Tech Progress Note Patient Details:  [name] [name] 12-Jun-1960 [id number] Brace order completed by [employer] vendor Trey Paula. Patient ID: [name] [name], male   DOB: 11/26/1960, 52 y.o.   MRN: [id number]   [name] [name] 04/14/2012, 8:37 PM

## 2012-04-14 NOTE — Progress Notes (Signed)
Orthopaedic Trauma Service Progress Note   Spoke with pts wife this am as she is not able to be here this am.  Many questions about discharge and restrictions.  As we have discussed previously pt will be NWB B for 8 weeks total ( 7 more weeks on L and 8 on right). Also pt will have unrestricted motion of his knees bilaterally and L ankle.  Wife also misunderstood what was being done today. She was under the impression that the ex fix was only being removed.  Informed that ex fix was being removed but also performing ORIF. Sounds like they want to return to Home with [facility] but agreeable to CIR consult as well. Wife wanted concrete answers about d/c but I discussed with her that disposition would be based on pts mobility level after this next procedure. She will be up here later this afternoon around 1500.  Mearl Latin, PA-C [facility] [phone] (P) 04/14/2012 9:16 AM

## 2012-04-14 NOTE — Anesthesia Procedure Notes (Signed)
Procedure Name: Intubation Date/Time: 04/14/2012 8:33 AM Performed by: Arlice Colt B Pre-anesthesia Checklist: Patient identified, Emergency Drugs available, Suction available, Patient being monitored and Timeout performed Patient Re-evaluated:Patient Re-evaluated prior to inductionOxygen Delivery Method: Circle system utilized Preoxygenation: Pre-oxygenation with 100% oxygen Intubation Type: IV induction Ventilation: Mask ventilation without difficulty Laryngoscope Size: Mac and 3 Grade View: Grade II Tube type: Oral Tube size: 7.5 mm Number of attempts: 1 Airway Equipment and Method: Stylet Placement Confirmation: ETT inserted through vocal cords under direct vision and breath sounds checked- equal and bilateral Secured at: 23 cm Tube secured with: Tape Dental Injury: Teeth and Oropharynx as per pre-operative assessment

## 2012-04-14 NOTE — Brief Op Note (Signed)
04/01/2012 - 04/14/2012  2:10 PM  PATIENT:  [name] [name]  52 y.o. male  PRE-OPERATIVE DIAGNOSIS:   1. RIGHT TIBIAL PLATEAU FRACTURE bicondylar  2. Right tibial eminence fracture  POST-OPERATIVE DIAGNOSIS: 1. RIGHT TIBIAL PLATEAU FRACTURE bicondylar  2. Right tibial eminence fracture  PROCEDURE:  Procedure(s): OPEN REDUCTION INTERNAL FIXATION (ORIF) TIBIAL PLATEAU (Right) bicondylar 2. Open repair of tibial eminence 3. Anterior compartment fasciotomy 4. Removal of external fixator under anesthesia  SURGEON:  Surgeon(s) and Role:    * Budd Palmer, MD - Primary  PHYSICIAN ASSISTANT: Montez Morita, [facility]  ANESTHESIA:   general  EBL:  Total I/O In: 2000 [I.V.:2000] Out: 1150 [Urine:850; Blood:300]  BLOOD ADMINISTERED:none  DRAINS: none   LOCAL MEDICATIONS USED:  NONE  SPECIMEN:  No Specimen  DISPOSITION OF SPECIMEN:  N/A  COUNTS:  YES  TOURNIQUET:   Total Tourniquet Time Documented: Thigh (Right) - 120 minutes Total: Thigh (Right) - 120 minutes   DICTATION: .Other Dictation: Dictation Number [id number]  PLAN OF CARE: Admit to inpatient   PATIENT DISPOSITION:  PACU - hemodynamically stable.   Delay start of Pharmacological VTE agent (>24hrs) due to surgical blood loss or risk of bleeding: no

## 2012-04-14 NOTE — Progress Notes (Signed)
ANTICOAGULATION CONSULT NOTE - Initial Consult  Pharmacy Consult for coumadin Indication: VTE prophylaxis  No Known Allergies  Patient Measurements: Height: 5\' 9"  (175.3 cm) Weight: 230 lb (104.327 kg) IBW/kg (Calculated) : 70.7  Vital Signs: Temp: 98.7 F (37.1 C) (03/11 1615) BP: 170/78 mmHg (03/11 1627) Pulse Rate: 93 (03/11 1515)  Labs:  Recent Labs  04/14/12 0650  HGB 11.8*  HCT 35.1*  PLT 693*  CREATININE 0.67    Estimated Creatinine Clearance: 127 ml/min (by C-G formula based on Cr of 0.67).   Medical History: History reviewed. No pertinent past medical history.  Medications:  No prescriptions prior to admission   Scheduled:  .  ceFAZolin (ANCEF) IV  1 g Intravenous Q8H  . [COMPLETED]  ceFAZolin (ANCEF) IV  2 g Intravenous Once  . docusate sodium  100 mg Oral BID  . [START ON 04/15/2012] enoxaparin (LOVENOX) injection  40 mg Subcutaneous Q24H  . ferrous sulfate  325 mg Oral TID PC  . HYDROmorphone      . methocarbamol  1,000 mg Oral QID  . morphine   Intravenous Q4H  . [COMPLETED] morphine      . multivitamin with minerals  1 tablet Oral Daily  . pantoprazole  40 mg Oral Q1200  . polyethylene glycol  17 g Oral Daily  . Vitamin D (Ergocalciferol)  50,000 Units Oral Q Fri  . [DISCONTINUED] enoxaparin (LOVENOX) injection  40 mg Subcutaneous Q24H    Assessment: 52 yo male s/p fall an now s/p ORIF tibial plateau (R)/ Patient to start coumadin tonight. INR baseline 0.9 on 04/01/12.  Goal of Therapy:  INR 2-3 Monitor platelets by anticoagulation protocol: Yes   Plan:  -Coumadin 7.5mg  po tonight -Daily PT/INR -Begin education process  Harland German, Ilda Basset D 04/14/2012 5:48 PM

## 2012-04-14 NOTE — Progress Notes (Signed)
PT NOTE:  Treatment cancelled today as patient in surgery. Will have patient re-evaled as appropriate pending orders Thanks 04/14/2012 Fredrich Birks PTA [phone] pager [phone] office

## 2012-04-14 NOTE — Progress Notes (Signed)
Call to dr. Michelle Piper for pacu orders. He said he would put them in and to begin giving dilauded.

## 2012-04-14 NOTE — Anesthesia Postprocedure Evaluation (Signed)
  Anesthesia Post-op Note  Patient: [name] [name]  Procedure(s) Performed: Procedure(s): OPEN REDUCTION INTERNAL FIXATION (ORIF) TIBIAL PLATEAU (Right)  Patient Location: PACU  Anesthesia Type:General  Level of Consciousness: awake  Airway and Oxygen Therapy: Patient Spontanous Breathing  Post-op Pain: mild  Post-op Assessment: Post-op Vital signs reviewed  Post-op Vital Signs: Reviewed  Complications: No apparent anesthesia complications

## 2012-04-14 NOTE — Progress Notes (Signed)
Physical medicine and rehabilitation consult requested. Patient with followup surgery again today 04/14/2012. Will complete a formal rehabilitation consult in a.m. once patient is more alert

## 2012-04-14 NOTE — Progress Notes (Signed)
Orthopedic Tech Progress Note Patient Details:  [name] [name] [birth year] [id number] Called [facility] for (R)LE brace. Patient ID: [name] [name], male   DOB: 08/22/1960, 52 y.o.   MRN: [id number]   [name] [name] 04/14/2012, 7:49 PM

## 2012-04-14 NOTE — Transfer of Care (Signed)
Immediate Anesthesia Transfer of Care Note  Patient: [name] [name]  Procedure(s) Performed: Procedure(s): OPEN REDUCTION INTERNAL FIXATION (ORIF) TIBIAL PLATEAU (Right)  Patient Location: PACU  Anesthesia Type:General  Level of Consciousness: awake, alert  and oriented  Airway & Oxygen Therapy: Patient Spontanous Breathing and Patient connected to face mask oxygen  Post-op Assessment: Report given to PACU RN and Post -op Vital signs reviewed and stable  Post vital signs: Reviewed and stable  Complications: No apparent anesthesia complications

## 2012-04-15 DIAGNOSIS — D62 Acute posthemorrhagic anemia: Secondary | ICD-10-CM | POA: Diagnosis not present

## 2012-04-15 LAB — PROTIME-INR: INR: 1.15 (ref 0.00–1.49)

## 2012-04-15 LAB — CBC
Platelets: 565 10*3/uL — ABNORMAL HIGH (ref 150–400)
RBC: 2.77 MIL/uL — ABNORMAL LOW (ref 4.22–5.81)
RDW: 13.5 % (ref 11.5–15.5)
WBC: 9.4 10*3/uL (ref 4.0–10.5)

## 2012-04-15 MED ORDER — PREGABALIN 50 MG PO CAPS
75.0000 mg | ORAL_CAPSULE | Freq: Every day | ORAL | Status: DC
  Filled 2012-04-15: qty 1

## 2012-04-15 MED ORDER — PANTOPRAZOLE SODIUM 40 MG PO TBEC: 40.0000 mg | DELAYED_RELEASE_TABLET | Freq: Every day | ORAL | Status: DC

## 2012-04-15 MED ORDER — WARFARIN SODIUM 7.5 MG PO TABS
7.5000 mg | ORAL_TABLET | Freq: Once | ORAL | Status: DC
  Filled 2012-04-15: qty 1

## 2012-04-15 MED ORDER — WARFARIN SODIUM 5 MG PO TABS: 5.0000 mg | ORAL_TABLET | Freq: Every day | ORAL | Status: DC

## 2012-04-15 MED ORDER — DSS 100 MG PO CAPS: 100.0000 mg | ORAL_CAPSULE | Freq: Two times a day (BID) | ORAL | Status: DC

## 2012-04-15 MED ORDER — METHOCARBAMOL 500 MG PO TABS: 500.0000 mg | ORAL_TABLET | Freq: Four times a day (QID) | ORAL | Status: DC

## 2012-04-15 MED ORDER — HYDROCODONE-ACETAMINOPHEN 7.5-325 MG PO TABS: 1.0000 | ORAL_TABLET | Freq: Four times a day (QID) | ORAL | Status: DC | PRN

## 2012-04-15 MED ORDER — ADULT MULTIVITAMIN W/MINERALS CH: 1.0000 | ORAL_TABLET | Freq: Every day | ORAL | Status: DC

## 2012-04-15 MED ORDER — ENOXAPARIN SODIUM 40 MG/0.4ML ~~LOC~~ SOLN: 40.0000 mg | SUBCUTANEOUS | Status: DC

## 2012-04-15 MED ORDER — VITAMIN D (ERGOCALCIFEROL) 1.25 MG (50000 UNIT) PO CAPS: 50000.0000 [IU] | ORAL_CAPSULE | ORAL | Status: DC

## 2012-04-15 MED ORDER — FERROUS SULFATE 325 (65 FE) MG PO TABS: 325.0000 mg | ORAL_TABLET | Freq: Three times a day (TID) | ORAL | Status: DC

## 2012-04-15 MED ORDER — ACETAMINOPHEN 325 MG PO TABS: 325.0000 mg | ORAL_TABLET | Freq: Four times a day (QID) | ORAL | Status: DC | PRN

## 2012-04-15 MED ORDER — POLYETHYLENE GLYCOL 3350 17 G PO PACK: 17.0000 g | PACK | Freq: Every day | ORAL | Status: DC

## 2012-04-15 MED ORDER — PREGABALIN 75 MG PO CAPS: 75.0000 mg | ORAL_CAPSULE | Freq: Every day | ORAL | Status: DC

## 2012-04-15 MED ORDER — HYDROCODONE-ACETAMINOPHEN 7.5-325 MG PO TABS
1.0000 | ORAL_TABLET | Freq: Four times a day (QID) | ORAL | Status: DC | PRN
  Administered 2012-04-15 (×2): 2 via ORAL
  Filled 2012-04-15 (×2): qty 2

## 2012-04-15 MED ORDER — OXYCODONE HCL 5 MG PO TABS: 5.0000 mg | ORAL_TABLET | ORAL | Status: DC | PRN
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
## 2012-04-15 NOTE — Progress Notes (Signed)
[facility] Progress Note  Subjective  Doing ok Pain tolerable No cp, sob No N/v  Agreeable to SNF  Objective  BP 125/61  Pulse 61  Temp(Src) 98.4 F (36.9 C) (Oral)  Resp 18  Ht 5\' 9"  (1.753 m)  Wt 104.327 kg (230 lb)  BMI 33.95 kg/m2  SpO2 100%  Patient Vitals for the past 24 hrs:  BP Temp Temp src Pulse Resp SpO2  04/15/12 0743 125/61 mmHg 98.4 F (36.9 C) Oral 61 18 100 %  04/15/12 0522 119/66 mmHg 98.4 F (36.9 C) Oral 72 18 97 %  04/15/12 0400 - - - - 11 100 %  04/15/12 0157 136/56 mmHg 98.9 F (37.2 C) Oral 99 18 96 %  04/14/12 2152 136/80 mmHg 97.9 F (36.6 C) Oral 94 18 95 %  04/14/12 1627 170/78 mmHg - - - - 93 %  04/14/12 1615 170/76 mmHg 98.7 F (37.1 C) - - - 95 %  04/14/12 1545 174/89 mmHg - - - - 95 %  04/14/12 1533 182/82 mmHg - - - 14 -  04/14/12 1515 168/68 mmHg - - 93 14 95 %  04/14/12 1445 176/91 mmHg - - 91 18 97 %  04/14/12 1437 175/84 mmHg 98.2 F (36.8 C) - 84 16 98 %    Intake/Output     03/11 0701 - 03/12 0700 03/12 0701 - 03/13 0700   P.O. 360    I.V. (mL/kg) 3510 (33.6)    Total Intake(mL/kg) 3870 (37.1)    Urine (mL/kg/hr) 2100 (0.8)    Blood 300 (0.1)    Total Output 2400     Net +1470            Labs  Results for SAWYER, MENTZER (MRN [id number]) as of 04/15/2012 09:36  Ref. Range 04/15/2012 05:55  Prothrombin Time Latest Range: 11.6-15.2 seconds 14.5  INR Latest Range: 0.00-1.49  1.15    Cbc and BMET pending   Exam  Gen: awake and alert, NAD Lungs: clear Cardiac: reg Abd: Soft, NT, + BS Ext:      Right Lower Extremity    Dressings c/d/i   Distal motor and sensory functions intact   Swelling stable   Skin wrinkles   Ext warm, + DP pulse   Drop lock hinge brace in place        Left Lower Extremity  Dressing changed yesterday in OR  Clean, intact  Distal motor and sensory functions intact  Ext warm   Assessment and Plan  52 year old male status post fall off of a truck with multiple  lower extremity fractures  1. comminuted right bicondylar tibial plateau fracture, Schatzker 5, OTA 41-C3 s/p ORIF POD1     Left lateral femoral condyle fracture, OTA 33-B1 s/p ORIF POD 7     Comminuted L calcaneus fx, sanders 3, OTA 82-C3 s/p ORIF POD 7                                                                      Bed to chair slide transfers, NWB B                         Aggressive ICE and elevation Bilaterally  L knee motion as tolerated, no motion restrictions (total knee precautions)   Right knee ROM as follows    NO ACTIVE KNEE EXTENSION    Prone knee ROM ok    Passive extension    Active and passive flexion   L ankle ROM as tolerated   Boot L ankle for comfort, can remove if pt wants                         Float heels                         Turn q 2 h and prn, decubitus precatiouns                         dressing changes as needed L leg                         Continue with therapies                          2. Pain  D/c pca             tylenol             Norco and Oxy IR             Robaxin   3. nicotine dependence            no nicotine supplementation of any kind             Pt understands the risks of continued smoking with respect to bone and soft tissue healing    4. DVT and PE prophylaxis             lovenox bridge to coumadin  Coumadin x 8 weeks 5. FEN             Diet as tolerated             Bowel program             protonix             6. Vitamin D deficiency/Obesity/hypogonadism                       see previous notes                         Will need outpt Endocrine follow up  7. Disposition                         Therapies                         SNF tomorrow    Mearl Latin, PA-C [facility] [phone] (P) 04/15/2012 4540    Addendum  Bed available at SNF for patient Check CBC before d/c, then pt can go to SNF today Pt agreeable to go to facility today  Mearl Latin,  PA-C [facility] [phone] (P) 04/15/2012 2:01 PM

## 2012-04-15 NOTE — Progress Notes (Signed)
ANTICOAGULATION CONSULT NOTE - Initial Consult  Pharmacy Consult for coumadin Indication: VTE prophylaxis  No Known Allergies  Patient Measurements: Height: 5\' 9"  (175.3 cm) Weight: 230 lb (104.327 kg) IBW/kg (Calculated) : 70.7  Vital Signs: Temp: 98.4 F (36.9 C) (03/12 0743) Temp src: Oral (03/12 0743) BP: 125/61 mmHg (03/12 0743) Pulse Rate: 61 (03/12 0743)  Labs:  Recent Labs  04/14/12 0650 04/15/12 0555  HGB 11.8*  --   HCT 35.1*  --   PLT 693*  --   LABPROT  --  14.5  INR  --  1.15  CREATININE 0.67  --     Estimated Creatinine Clearance: 127 ml/min (by C-G formula based on Cr of 0.67).   Medical History: History reviewed. No pertinent past medical history.  Assessment: 52 yo male s/p fall an now s/p ORIF tibial plateau (R)/ Patient to start coumadin tonight. INR baseline 0.9 on 04/01/12.  INR today 1.15  Goal of Therapy:  INR 2-3 Monitor platelets by anticoagulation protocol: Yes   Plan:  -Coumadin 7.5mg  x 1 dose -Daily PT/INR   Celedonio Miyamoto, PharmD, BCPS Clinical Pharmacist Pager [phone]   04/15/2012 12:48 PM

## 2012-04-15 NOTE — Progress Notes (Signed)
CARE MANAGEMENT NOTE 04/15/2012 Anticipated DC Date:  04/15/2012   Anticipated DC Plan:  SKILLED NURSING FACILITY  In-house referral  Clinical Social Worker      DC Planning Services  CM consult      Choice offered to / List presented to:             Status of service:  Completed, signed off Medicare Important Message given?   (If response is "NO", the following Medicare IM given date fields will be blank) Date Medicare IM given:   Date Additional Medicare IM given:    Discharge Disposition:  SKILLED NURSING FACILITY  Per UR Regulation:    If discussed at Long Length of Stay Meetings, dates discussed:    Comments:  04/15/12 11:15am Vance Peper, RN BSN Case Manager CM notified Blima Dessert that patient will be discharged to [facility] on today. [employer] will be called to provide transportation.

## 2012-04-15 NOTE — Op Note (Signed)
NAMEJOFFRE, LUCKS NO.:  [id number]  MEDICAL RECORD NO.:  [id number]  LOCATION:  [id number]                        FACILITY:  [facility]  PHYSICIAN:  Doralee Albino. Carola Frost, M.D. DATE OF BIRTH:  [birth year]  DATE OF PROCEDURE:  04/14/2012 DATE OF DISCHARGE:                              OPERATIVE REPORT   PREOPERATIVE DIAGNOSES: 1. Right bicondylar tibial plateau fracture. 2. Tibial eminence fracture. 3. Retained external fixator, spanning.  POSTOPERATIVE DIAGNOSES: 1. Right bicondylar tibial plateau fracture. 2. Tibial eminence fracture. 3. Retained external fixator, spanning.  PROCEDURES: 1. Open reduction and internal fixation of right bicondylar tibial     plateau fracture. 2. Repair of right tibial eminence fracture. 3. Anterior compartment fasciotomy. 4. Removal of external fixator under anesthesia.  SURGEON:  Doralee Albino. Carola Frost, M.D.  ASSISTANT:  Mearl Latin, [place]  ANESTHESIA:  General.  COMPLICATIONS:  None.  FINDINGS:  Intact lateral and medial menisci.  SPECIMEN:  None.  DRAINS:  None.  TOTAL TOURNIQUET TIME:  120 minutes.  DISPOSITION:  To PACU.  CONDITION:  Stable.  BRIEF SUMMARY AND INDICATION OF PROCEDURE:  [name] [name] is a 52 year old male who sustained multiple bilateral injuries in a fall from approximately 5 feet.  This included severe right bicondylar plateau and eminence, a left distal femur, and a left calcaneus fracture.  I did discuss with the patient the risks and benefits of surgery including the possibility of infection, nerve injury, vessel injury, DVT, PE, heart attack, stroke, loss of motion, and need for further surgery, symptomatic hardware, and many others.  The patient understood these risks and did wish to proceed.  BRIEF SUMMARY OF PROCEDURE:  [name] [name] was given preoperative antibiotics,  taken to the operating room where general anesthesia was induced.  His right lower extremity then underwent removal of  the external fixator, and while he was under anesthesia a very aggressive debridement was performed of the superficial tissues around the pin sites.  These were irrigated and then after the chlorhexidine scrub, and then a standard prep and drape was performed.  The pin sites were isolated from the wound as well.  A posterior medial approach was then made to the fracture, I carefully identified the saphenous vein and protecting it.  I did identify the split in the medial plateau which has developed and cleaned of hematoma. No stripping was performed of this fragment, continued posterior to it on outside of the soft tissue envelope and was able to make another longitudinal incision and it enabled the access there.  With the help of my assistant, who applied extension external rotation, as well as the use of K-wires as joysticks into the articular segment.  We were able to bring it into reduced position and hold it compressed with the pointed tenaculum.  This was followed by placement of 2 K-wires.  A medial buttress plate was then applied contouring the top of the plate such that I could place a single lock screw into the medial plateau.  After reconstructing this side I then turned attention to the lateral side, where a curvilinear standard anterolateral approach was made. Dissection carried down to the articular surface which was a free blockers  extensive damage.  The meniscus was intact.  I was also able to visualize the medial meniscus through the development of that fracture site and there again which was not anticipated the meniscus was in excellent condition and was well preserved.  Piece meal reconstruction was then performed of the lateral side.  There was extensive involvement of the tibial eminence and this required quite a bit of individual tension passing a #5 FiberWire over the anterior portion of the plateau using #5 and 2-0 FiberWire for the eminence segments, and  placing sutures through the cruciate.  These were then pulled distally.  There was enormous quantity of bone loss and this defect would ultimately be treated by a mixture of grafting from cleaning of the fracture site and some loose cancellous bone as well as 40 mL of cancellous chips.  We were able to reconstruct the lateral side but again there was such substantial damage all the way across the plateau that it was a very difficult reconstruction.  Our primary goals were restoration of alignment and restoration of stability by repairing the tibial eminence, protection of the menisci which remained intact and then lastly was articular reduction.  We were able, however, to restore appropriate alignment.  This was aided by the use of the [facility] clamp in addition to a standard fixation in the shaft.  A additional compression across the articular surface, and then placement of further screws.  During the compression across the articular surface, we did have some bending of the most proximal aspect of the medial plate and this ultimately required removal of the proximal hole and then placement of an additional screw to the pin ultimate screw hole.  My assistant, Montez Morita, assisted me throughout and we did leave the tourniquet inflated for 2 hours given the considerable metaphyseal bone loss and potential for bleeding there.  I did also place a lag screw, a low profile washer from one of the tibia tubercle fragments that needed to be reconstructed as well, and again multiple FiberWire assist we ended up with 4 involving the extensor mechanism, the anterior cortex and the eminence were repaired around the plates both medially and laterally.  Lastly, curved Metzenbaum was used to spread both superficially and deep to the anterior compartment fascia and this fasciotomy was then performed.  We did not encounter any bleeding with this and we were able to palpate a complete release.  Wounds were  irrigated thoroughly, closed in layered fashion using #1 Vicryl for the arthrotomy laterally, the anterior compartment was left open, and then 2-0 Vicryl, 3-0 Vicryl, and nylon sterile gently compressive dressing was applied.  The patient was awakened from anesthesia and transported to the PACU in stable condition.  Again, Montez Morita, PA-C, did assist me throughout and was absolutely necessary for all portions of the case.  PROGNOSIS:  The patient will be nonweightbearing with unrestricted range of motion, as  able.  However, this will be prone active flexion and passive extension given the extensive comminution that did extend up into the articular surface.  He is at increased risk for complications including systemic ones and DVT because of his polytrauma and will be on pharmacologic prophylaxis with Lovenox at least 20 days after discharge, perhaps longer with Coumadin.     Doralee Albino. Carola Frost, M.D.     MHH/MEDQ  D:  04/14/2012  T:  04/15/2012  Job:  [id number]

## 2012-04-18 ENCOUNTER — Encounter (HOSPITAL_COMMUNITY): Payer: Self-pay | Admitting: Orthopedic Surgery

## 2012-04-20 NOTE — Progress Notes (Signed)
I saw and examined the patient with [name], communicating the findings and plan noted above.  [name], MD [facility] [phone] (p)  

## 2012-04-20 NOTE — Progress Notes (Signed)
I saw and examined the patient. I agree with the findings above.  Myrene Galas, MD [facility] [phone] (p)

## 2013-04-20 ENCOUNTER — Ambulatory Visit (INDEPENDENT_AMBULATORY_CARE_PROVIDER_SITE_OTHER): Payer: Worker's Compensation | Admitting: Endocrinology

## 2013-04-20 ENCOUNTER — Encounter: Payer: Self-pay | Admitting: Endocrinology

## 2013-04-20 VITALS — BP 120/78 | HR 73 | Temp 98.1°F | Ht 69.0 in | Wt 255.0 lb

## 2013-04-20 DIAGNOSIS — Z125 Encounter for screening for malignant neoplasm of prostate: Secondary | ICD-10-CM | POA: Insufficient documentation

## 2013-04-20 DIAGNOSIS — E291 Testicular hypofunction: Secondary | ICD-10-CM

## 2013-04-20 NOTE — Progress Notes (Signed)
Subjective:    Patient ID: [name] [name], male    DOB: 12-Oct-1960, 53 y.o.   MRN: [id number]  HPI Pt reports he had puberty at the normal age.  He has 2 biological children.  He has no h/o infertility.  He has never taken illicit androgens.  He has never taken prescribed androgens.  He has no h/o head injury, or surgery.   In early 2014, pt fell at work, suffering severe injuries to both lower extremities, but no assoc injury to the genitals.  After surgery, he was advised he had a low testosterone level.  The question is raised: is the hypogonadism impairing his recovery?  No past medical history on file.  Past Surgical History  Procedure Laterality Date  . Ankle fracture surgery  2010  . External fixation leg  04/01/2012    ex fix R tibial plateau fracture  . Orif tibia plateau Right 04/01/2012    Procedure: TIBIAL PLATEAU/External Fixation;  Surgeon: Mable ParisJustin William Chandler, MD;  [facility];  Service: Orthopedics;  Laterality: Right;  . Orif femur fracture Left 04/06/2012    Procedure: OPEN REDUCTION INTERNAL FIXATION (ORIF) DISTAL FEMUR FRACTURE;  Surgeon: Budd PalmerMichael H Handy, MD;  Location: [facility] OR;  Service: Orthopedics;  Laterality: Left;  Biomet Proximal Tibia Plate, 7.3 Cannulated screws, Synthes small fragment set.  . Orif tibia plateau Right 04/14/2012    Procedure: OPEN REDUCTION INTERNAL FIXATION (ORIF) TIBIAL PLATEAU;  Surgeon: Budd PalmerMichael H Handy, MD;  Location: [facility] OR;  Service: Orthopedics;  Laterality: Right;    History   Social History  . Marital Status: Married    Spouse Name: N/A    Number of Children: N/A  . Years of Education: N/A   Occupational History  . Not on file.   Social History Main Topics  . Smoking status: Current Every Day Smoker -- 1.50 packs/day    Types: Cigarettes  . Smokeless tobacco: Not on file  . Alcohol Use: Yes     Comment: daily  . Drug Use: No     Comment: past hx of marijuana use, quit > 5 years ago  . Sexual Activity: Not on file    Other Topics Concern  . Not on file   Social History Narrative  . No narrative on file    Current Outpatient Prescriptions on File Prior to Visit  Medication Sig Dispense Refill  . acetaminophen (TYLENOL) 325 MG tablet Take 1-2 tablets (325-650 mg total) by mouth every 6 (six) hours as needed.      . docusate sodium 100 MG CAPS Take 100 mg by mouth 2 (two) times daily.  20 capsule  0  . enoxaparin (LOVENOX) 40 MG/0.4ML injection Inject 0.4 mLs (40 mg total) into the skin daily.  7 Syringe  0  . ferrous sulfate 325 (65 FE) MG tablet Take 1 tablet (325 mg total) by mouth 3 (three) times daily after meals.  90 tablet  1  . HYDROcodone-acetaminophen (NORCO) 7.5-325 MG per tablet Take 1-2 tablets by mouth every 6 (six) hours as needed.  90 tablet  0  . methocarbamol (ROBAXIN) 500 MG tablet Take 1-2 tablets (500-1,000 mg total) by mouth 4 (four) times daily.  80 tablet  0  . Multiple Vitamin (MULTIVITAMIN WITH MINERALS) TABS Take 1 tablet by mouth daily.      Marland Kitchen. oxyCODONE (OXY IR/ROXICODONE) 5 MG immediate release tablet Take 1-2 tablets (5-10 mg total) by mouth every 3 (three) hours as needed.  80 tablet  0  .  pantoprazole (PROTONIX) 40 MG tablet Take 1 tablet (40 mg total) by mouth daily at 12 noon.  30 tablet  1  . polyethylene glycol (MIRALAX / GLYCOLAX) packet Take 17 g by mouth daily.  14 each    . pregabalin (LYRICA) 75 MG capsule Take 1 capsule (75 mg total) by mouth daily.  60 capsule  1  . Vitamin D, Ergocalciferol, (DRISDOL) 50000 UNITS CAPS Take 1 capsule (50,000 Units total) by mouth every Friday.  8 capsule  1  . warfarin (COUMADIN) 5 MG tablet Take 1 tablet (5 mg total) by mouth daily.  60 tablet  1   No current facility-administered medications on file prior to visit.   No Known Allergies  No family history on file.  BP 120/78  Pulse 73  Temp(Src) 98.1 F (36.7 C) (Oral)  Ht 5\' 9"  (1.753 m)  Wt 255 lb (115.667 kg)  BMI 37.64 kg/m2  SpO2 95%  Review of  Systems denies depression, erectile dysfunction, weight change, decreased urinary stream, gynecomastia, fever, headache, easy bruising, sob, rash, blurry vision, rhinorrhea, chest pain.  He has intermittent numbness of the LE's.  He says LE weakness is due to pain.  He has fatigue.      Objective:   Physical Exam VS: see vs page GEN: no distress HEAD: head: no deformity eyes: no periorbital swelling, no proptosis external nose and ears are normal mouth: no lesion seen NECK: supple, thyroid is not enlarged CHEST WALL: no deformity LUNGS: clear to auscultation BREASTS:  Bilateral pseudogynecomastia.   CV: reg rate and rhythm, no murmur ABD: abdomen is soft, nontender.  no hepatosplenomegaly.  not distended.  no hernia GENITALIA:  Normal male.   MUSCULOSKELETAL: muscle bulk and strength are grossly normal.  no obvious joint swelling.  gait is normal and steady EXTEMITIES: no deformity.  no ulcer on the feet.  feet are of normal color and temp.  no edema.  Several old healed surgical scars on the legs.  PULSES: dorsalis pedis intact bilat.  no carotid bruit. NEURO:  cn 2-12 grossly intact.   readily moves all 4's.  sensation is intact to touch on the feet SKIN:  Normal texture and temperature.  No rash or suspicious lesion is visible.  Normal hair distribution. NODES:  None palpable at the neck PSYCH: alert, well-oriented.  Does not appear anxious nor depressed.    Lab Results  Component Value Date   TESTOSTERONE 35* 04/03/2012      Assessment & Plan:  Secondary hypogonadism: uncertain etiology Several fractures: I am uncertain if his functional recovery is being limited by hypogonadism. Obesity: this is complicating both of the two above problems.

## 2013-04-20 NOTE — Patient Instructions (Addendum)
Let's repeat the blood tests.  We'll contact you with results. If the results are similar, you should have an CT of the brain.  normalization of testosterone is not known to harm you.  however, there are "theoretical" risks, including increased fertility, hair loss, prostate cancer, benign prostate enlargement, blood clots, liver problems, lower hdl ("good cholesterol"), sleep apnea, and behavior changes.

## 2013-08-01 ENCOUNTER — Encounter (HOSPITAL_BASED_OUTPATIENT_CLINIC_OR_DEPARTMENT_OTHER): Payer: Self-pay | Admitting: Emergency Medicine

## 2013-08-01 ENCOUNTER — Emergency Department (HOSPITAL_BASED_OUTPATIENT_CLINIC_OR_DEPARTMENT_OTHER)
Admission: EM | Admit: 2013-08-01 | Discharge: 2013-08-01 | Disposition: A | Discharge status: Home / Self Care | Payer: No Typology Code available for payment source | Attending: Emergency Medicine | Admitting: Emergency Medicine

## 2013-08-01 DIAGNOSIS — Y9389 Activity, other specified: Secondary | ICD-10-CM | POA: Insufficient documentation

## 2013-08-01 DIAGNOSIS — W57XXXA Bitten or stung by nonvenomous insect and other nonvenomous arthropods, initial encounter: Principal | ICD-10-CM | POA: Insufficient documentation

## 2013-08-01 DIAGNOSIS — F17200 Nicotine dependence, unspecified, uncomplicated: Secondary | ICD-10-CM | POA: Insufficient documentation

## 2013-08-01 DIAGNOSIS — T63334A Toxic effect of venom of brown recluse spider, undetermined, initial encounter: Secondary | ICD-10-CM

## 2013-08-01 DIAGNOSIS — Z7901 Long term (current) use of anticoagulants: Secondary | ICD-10-CM | POA: Insufficient documentation

## 2013-08-01 DIAGNOSIS — R509 Fever, unspecified: Secondary | ICD-10-CM | POA: Insufficient documentation

## 2013-08-01 DIAGNOSIS — Z79899 Other long term (current) drug therapy: Secondary | ICD-10-CM | POA: Insufficient documentation

## 2013-08-01 DIAGNOSIS — S40269A Insect bite (nonvenomous) of unspecified shoulder, initial encounter: Secondary | ICD-10-CM | POA: Insufficient documentation

## 2013-08-01 DIAGNOSIS — Y92009 Unspecified place in unspecified non-institutional (private) residence as the place of occurrence of the external cause: Secondary | ICD-10-CM | POA: Insufficient documentation

## 2013-08-01 LAB — CBC
HCT: 46.2 % (ref 39.0–52.0)
Hemoglobin: 16.1 g/dL (ref 13.0–17.0)
MCH: 33.3 pg (ref 26.0–34.0)
MCHC: 34.8 g/dL (ref 30.0–36.0)
MCV: 95.7 fL (ref 78.0–100.0)
PLATELETS: 212 10*3/uL (ref 150–400)
RBC: 4.83 MIL/uL (ref 4.22–5.81)
RDW: 12.7 % (ref 11.5–15.5)
WBC: 6.8 10*3/uL (ref 4.0–10.5)

## 2013-08-01 MED ORDER — ERYTHROMYCIN BASE 333 MG PO TBEC
333.0000 mg | DELAYED_RELEASE_TABLET | Freq: Four times a day (QID) | ORAL | Status: DC
  Filled 2013-08-01: qty 1

## 2013-08-01 MED ORDER — TETANUS-DIPHTH-ACELL PERTUSSIS 5-2.5-18.5 LF-MCG/0.5 IM SUSP
0.5000 mL | Freq: Once | INTRAMUSCULAR | Status: AC
  Administered 2013-08-01: 0.5 mL via INTRAMUSCULAR
  Filled 2013-08-01: qty 0.5

## 2013-08-01 MED ORDER — ERYTHROMYCIN BASE 250 MG PO TABS: 500.0000 mg | ORAL_TABLET | Freq: Two times a day (BID) | ORAL | Status: DC

## 2013-08-01 NOTE — Discharge Instructions (Signed)
Brown Recluse Spider Bite °A brown recluse spider may be dark brown to light tan in color. It has a band of darker color shaped like a violin on its back. The whole spider (with legs) may grow to the size of 1 inch (2.5 cm). These spiders live undercover, outdoors, and in out-of-the-way places indoors. They can be found in the [place] on the [place], [place], and mostly in the [place]. Brown recluse spider bites can be serious and life-threatening. °SYMPTOMS  °Symptoms can get worse over several days and may include: °· Pain at the bite site. This may begin as a small, painful blister with redness around it. The pain and sore (lesion) caused by the bite can increase and spread over time. This may result in an area of tissue death up to 12 inches (30 cm) wide. °· General feeling of illness (malaise). °· Nausea and vomiting. °· Fever. °· Body aches. °TREATMENT  °Your caregiver may prescribe a drug to prevent tissue death or to treat your lesion. If a large lesion develops, surgery may be needed to remove the damaged tissue. Certain medicines and antibiotics may be prescribed depending on the severity of your illness. However, there is no single antidote to treat this bite. Treatment focuses on caring for your wound. °HOME CARE INSTRUCTIONS  °· Do not scratch the bite area. Keep the area clean and covered with an adhesive bandage or sterile gauze bandage. °· Wash the area daily in warm, soapy water. °· Put ice or cool compresses on the bite area. °¨ Put ice in a plastic bag. °¨ Place a towel between your skin and the bag. °¨ Leave the ice on for 20 to 30 minutes, 3 to 4 times a day or as directed. °· Keep the bite area elevated above the level of your heart. This helps reduce swelling. °· Take medicines as directed by your caregiver. °You may need a tetanus shot if: °· You cannot remember when you had your last tetanus shot. °· You have never had a tetanus shot. °· The injury broke your skin. °If you get a tetanus  shot, your arm may swell, get red, and feel warm to the touch. This is common and not a problem. If you need a tetanus shot and you choose not to have one, there is a rare chance of getting tetanus. Sickness from tetanus can be serious. °SEEK MEDICAL CARE IF:  °· Your symptoms do not improve in 24 hours or are getting worse. °· You have increasing pain in the bite area. °SEEK IMMEDIATE MEDICAL CARE IF:  °· Your lesion appears to be getting larger (more than ¼ inch [5 mm]), growing deeper, or looks infected. °· You have chills or a fever. °· You feel nauseous, vomit, have muscle aches, weakness, extreme tiredness, convulsions, or a red rash. °· Your urine output decreases. °· You have blood in your urine or notice other unusual bleeding. °· Your skin turns yellow. °MAKE SURE YOU:  °· Understand these instructions. °· Will watch your condition. °· Will get help right away if you are not doing well or get worse. °Document Released: 01/21/2005 Document Revised: 04/15/2011 Document Reviewed: 08/22/2010 °Exit[employer]® Patient Information ©2015 [employer]. This information is not intended to replace advice given to you by your health care provider. Make sure you discuss any questions you have with your health care provider. ° °

## 2013-08-01 NOTE — ED Notes (Signed)
Pt noted area to left shoulder that looks as if he was bite by something, day after being bite he states that it felt like a sunburn to area, at time he was cleaning spider webs, now area is raised, blister and necrotic looking in the center

## 2013-08-01 NOTE — ED Provider Notes (Signed)
CSN: [id number]     Arrival date & time 08/01/13  1959 History   First MD Initiated Contact with Patient 08/01/13 2022     Chief Complaint  Patient presents with  . Insect Bite     (Consider location/radiation/quality/duration/timing/severity/associated sxs/prior Treatment) Patient is a 53 y.o. male presenting with animal bite. The history is provided by the patient. No language interpreter was used.  Animal Bite Contact animal:  Insect Associated symptoms: fever   Associated symptoms comment:  He was cleaning out his garage 5 days ago and feels he was bitten on the left shoulder by an insect. The area continues to blister, discolor and have surrounding redness. Symptoms initially included a burning sensation but he states it is minimally painful now. He reports a low grade temperature yesterday when he went to a clinic for evaluation. No diagnosis was made or treatment offered.    History reviewed. No pertinent past medical history. Past Surgical History  Procedure Laterality Date  . Ankle fracture surgery  2010  . External fixation leg  04/01/2012    ex fix R tibial plateau fracture  . Orif tibia plateau Right 04/01/2012    Procedure: TIBIAL PLATEAU/External Fixation;  Surgeon: Mable ParisJustin William Chandler, MD;  [facility];  Service: Orthopedics;  Laterality: Right;  . Orif femur fracture Left 04/06/2012    Procedure: OPEN REDUCTION INTERNAL FIXATION (ORIF) DISTAL FEMUR FRACTURE;  Surgeon: Budd PalmerMichael H Handy, MD;  Location: [facility] OR;  Service: Orthopedics;  Laterality: Left;  Biomet Proximal Tibia Plate, 7.3 Cannulated screws, Synthes small fragment set.  . Orif tibia plateau Right 04/14/2012    Procedure: OPEN REDUCTION INTERNAL FIXATION (ORIF) TIBIAL PLATEAU;  Surgeon: Budd PalmerMichael H Handy, MD;  Location: [facility] OR;  Service: Orthopedics;  Laterality: Right;   History reviewed. No pertinent family history. History  Substance Use Topics  . Smoking status: Current Every Day Smoker -- 1.50 packs/day    Types: Cigarettes  . Smokeless tobacco: Not on file  . Alcohol Use: Yes     Comment: daily    Review of Systems  Constitutional: Positive for fever and chills.  Musculoskeletal: Negative for arthralgias and myalgias.  Skin: Positive for wound.      Allergies  Review of patient's allergies indicates no known allergies.  Home Medications   Prior to Admission medications   Medication Sig Start Date End Date Taking? Authorizing Provider  acetaminophen (TYLENOL) 325 MG tablet Take 1-2 tablets (325-650 mg total) by mouth every 6 (six) hours as needed. 04/15/12   Mearl LatinKeith W Paul, PA-C  docusate sodium 100 MG CAPS Take 100 mg by mouth 2 (two) times daily. 04/15/12   Mearl LatinKeith W Paul, PA-C  enoxaparin (LOVENOX) 40 MG/0.4ML injection Inject 0.4 mLs (40 mg total) into the skin daily. 04/15/12   Mearl LatinKeith W Paul, PA-C  ferrous sulfate 325 (65 FE) MG tablet Take 1 tablet (325 mg total) by mouth 3 (three) times daily after meals. 04/15/12   Mearl LatinKeith W Paul, PA-C  HYDROcodone-acetaminophen [facility](NORCO) 7.5-325 MG per tablet Take 1-2 tablets by mouth every 6 (six) hours as needed. 04/15/12   Mearl LatinKeith W Paul, PA-C  methocarbamol (ROBAXIN) 500 MG tablet Take 1-2 tablets (500-1,000 mg total) by mouth 4 (four) times daily. 04/15/12   Mearl LatinKeith W Paul, PA-C  Multiple Vitamin (MULTIVITAMIN WITH MINERALS) TABS Take 1 tablet by mouth daily. 04/15/12   Mearl LatinKeith W Paul, PA-C  oxyCODONE (OXY IR/ROXICODONE) 5 MG immediate release tablet Take 1-2 tablets (5-10 mg total) by mouth every 3 (three)  hours as needed. 04/15/12   Mearl LatinKeith W Paul, PA-C  pantoprazole (PROTONIX) 40 MG tablet Take 1 tablet (40 mg total) by mouth daily at 12 noon. 04/15/12   Mearl LatinKeith W Paul, PA-C  polyethylene glycol [facility](MIRALAX / Ethelene HalGLYCOLAX) packet Take 17 g by mouth daily. 04/15/12   Mearl LatinKeith W Paul, PA-C  pregabalin (LYRICA) 75 MG capsule Take 1 capsule (75 mg total) by mouth daily. 04/15/12   Mearl LatinKeith W Paul, PA-C  Vitamin D, Ergocalciferol, (DRISDOL) 50000 UNITS CAPS Take 1 capsule (50,000  Units total) by mouth every Friday. 04/15/12   Mearl LatinKeith W Paul, PA-C  warfarin (COUMADIN) 5 MG tablet Take 1 tablet (5 mg total) by mouth daily. 04/15/12   Mearl LatinKeith W Paul, PA-C   BP 157/91  Pulse 78  Temp(Src) 98.1 F (36.7 C) (Oral)  Resp 23  Ht 5\' 9"  (1.753 m)  Wt 250 lb (113.399 kg)  BMI 36.90 kg/m2  SpO2 97% Physical Exam  Constitutional: He is oriented to person, place, and time. He appears well-developed and well-nourished.  Neck: Normal range of motion.  Pulmonary/Chest: Effort normal.  Musculoskeletal: Normal range of motion.  Neurological: He is alert and oriented to person, place, and time.  Skin: Skin is warm and dry.  Large blistering lesion to left superior shoulder with central necrosis. Mildly tender. There is surrounding erythema that is mild. No drainage. Blister intact.   Psychiatric: He has a normal mood and affect.    ED Course  Procedures (including critical care time) Labs Review Labs Reviewed  WOUND CULTURE  CBC    Imaging Review No results found.   EKG Interpretation None      MDM   Final diagnoses:  None    1. Manson PasseyBrown Recluse spider bite  Culture pending. No leukocytosis. Blister still intact, no immediate debridement necessary. Will cover with antibiotics. Pain medication offered but patient declined.     Arnoldo HookerShari A Upstill, PA-C 08/01/13 2206

## 2013-08-02 NOTE — ED Provider Notes (Signed)
Medical screening examination/treatment/procedure(s) were conducted as a shared visit with non-physician practitioner(s) and myself.  I personally evaluated the patient during the encounter   .Face to face Exam:  General:  A&Ox3 HEENT:  Atraumatic Resp:  Normal effort Abd:  Nondistended Neuro:No focal deficits     Nelia Shiobert L Beaton, MD 08/02/13 [id number]

## 2013-08-04 LAB — WOUND CULTURE
Culture: NO GROWTH
Gram Stain: NONE SEEN

## 2013-08-07 ENCOUNTER — Encounter (HOSPITAL_COMMUNITY): Payer: Self-pay | Admitting: Emergency Medicine

## 2013-08-07 ENCOUNTER — Emergency Department (HOSPITAL_COMMUNITY)
Admission: EM | Admit: 2013-08-07 | Discharge: 2013-08-07 | Disposition: A | Discharge status: Home / Self Care | Payer: No Typology Code available for payment source | Attending: Emergency Medicine | Admitting: Emergency Medicine

## 2013-08-07 ENCOUNTER — Emergency Department (HOSPITAL_COMMUNITY): Payer: No Typology Code available for payment source

## 2013-08-07 DIAGNOSIS — Y9289 Other specified places as the place of occurrence of the external cause: Secondary | ICD-10-CM | POA: Insufficient documentation

## 2013-08-07 DIAGNOSIS — Z792 Long term (current) use of antibiotics: Secondary | ICD-10-CM | POA: Insufficient documentation

## 2013-08-07 DIAGNOSIS — S82891A Other fracture of right lower leg, initial encounter for closed fracture: Secondary | ICD-10-CM

## 2013-08-07 DIAGNOSIS — Z9889 Other specified postprocedural states: Secondary | ICD-10-CM | POA: Insufficient documentation

## 2013-08-07 DIAGNOSIS — Y9389 Activity, other specified: Secondary | ICD-10-CM | POA: Insufficient documentation

## 2013-08-07 DIAGNOSIS — W010XXA Fall on same level from slipping, tripping and stumbling without subsequent striking against object, initial encounter: Secondary | ICD-10-CM | POA: Insufficient documentation

## 2013-08-07 DIAGNOSIS — F17200 Nicotine dependence, unspecified, uncomplicated: Secondary | ICD-10-CM | POA: Insufficient documentation

## 2013-08-07 DIAGNOSIS — S82009A Unspecified fracture of unspecified patella, initial encounter for closed fracture: Secondary | ICD-10-CM | POA: Insufficient documentation

## 2013-08-07 LAB — CBC WITH DIFFERENTIAL/PLATELET
Basophils Absolute: 0.1 10*3/uL (ref 0.0–0.1)
Basophils Relative: 0 % (ref 0–1)
Eosinophils Absolute: 0.2 10*3/uL (ref 0.0–0.7)
Eosinophils Relative: 1 % (ref 0–5)
HEMATOCRIT: 44.4 % (ref 39.0–52.0)
HEMOGLOBIN: 15.1 g/dL (ref 13.0–17.0)
LYMPHS PCT: 26 % (ref 12–46)
Lymphs Abs: 2.9 10*3/uL (ref 0.7–4.0)
MCH: 33 pg (ref 26.0–34.0)
MCHC: 34 g/dL (ref 30.0–36.0)
MCV: 97.2 fL (ref 78.0–100.0)
MONO ABS: 0.7 10*3/uL (ref 0.1–1.0)
Monocytes Relative: 7 % (ref 3–12)
Neutro Abs: 7.4 10*3/uL (ref 1.7–7.7)
Neutrophils Relative %: 66 % (ref 43–77)
Platelets: 304 10*3/uL (ref 150–400)
RBC: 4.57 MIL/uL (ref 4.22–5.81)
RDW: 12.9 % (ref 11.5–15.5)
WBC: 11.3 10*3/uL — AB (ref 4.0–10.5)

## 2013-08-07 LAB — BASIC METABOLIC PANEL
Anion gap: 14 (ref 5–15)
BUN: 9 mg/dL (ref 6–23)
CHLORIDE: 100 meq/L (ref 96–112)
CO2: 25 meq/L (ref 19–32)
CREATININE: 0.66 mg/dL (ref 0.50–1.35)
Calcium: 9 mg/dL (ref 8.4–10.5)
GFR calc Af Amer: >90 mL/min (ref >90)
GFR calc non Af Amer: >90 mL/min (ref >90)
Glucose, Bld: 116 mg/dL — ABNORMAL HIGH (ref 70–99)
Potassium: 4.5 mEq/L (ref 3.7–5.3)
Sodium: 139 mEq/L (ref 137–147)

## 2013-08-07 LAB — PROTIME-INR
INR: 0.93 (ref 0.00–1.49)
Prothrombin Time: 12.5 seconds (ref 11.6–15.2)

## 2013-08-07 MED ORDER — HYDROCODONE-ACETAMINOPHEN 5-325 MG PO TABS
1.0000 | ORAL_TABLET | Freq: Once | ORAL | Status: AC
  Administered 2013-08-07: 1 via ORAL
  Filled 2013-08-07: qty 1

## 2013-08-07 MED ORDER — HYDROMORPHONE HCL PF 1 MG/ML IJ SOLN
1.0000 mg | Freq: Once | INTRAMUSCULAR | Status: AC
  Administered 2013-08-07: 1 mg via INTRAVENOUS
  Filled 2013-08-07: qty 1

## 2013-08-07 MED ORDER — OXYCODONE-ACETAMINOPHEN 5-325 MG PO TABS: 1.0000 | ORAL_TABLET | ORAL | Status: DC | PRN

## 2013-08-07 MED ORDER — HYDROCODONE-ACETAMINOPHEN 5-325 MG PO TABS: 1.0000 | ORAL_TABLET | ORAL | Status: DC | PRN

## 2013-08-07 MED ORDER — MORPHINE SULFATE 4 MG/ML IJ SOLN
6.0000 mg | Freq: Once | INTRAMUSCULAR | Status: AC
  Administered 2013-08-07: 6 mg via INTRAVENOUS
  Filled 2013-08-07: qty 2

## 2013-08-07 NOTE — ED Notes (Signed)
Pt denied need for crutches. MD aware. Order d/c.

## 2013-08-07 NOTE — ED Provider Notes (Signed)
CSN: [id number]     Arrival date & time 08/07/13  1351 History   First MD Initiated Contact with Patient 08/07/13 1413     Chief Complaint  Patient presents with  . Knee Injury     (Consider location/radiation/quality/duration/timing/severity/associated sxs/prior Treatment) Patient is a 53 y.o. male presenting with knee pain. The history is provided by the patient (the pt fell today and hurt his right knee).  Knee Pain Lower extremity pain location: knee. Injury: yes   Mechanism of injury: fall   Fall:    Fall occurred: fell from standing.   Impact surface:  Hard floor   Point of impact:  Unable to specify   Entrapped after fall: no   Pain details:    Quality:  Aching   Radiates to:  Does not radiate Associated symptoms: no back pain and no fatigue     History reviewed. No pertinent past medical history. Past Surgical History  Procedure Laterality Date  . Ankle fracture surgery  2010  . External fixation leg  04/01/2012    ex fix R tibial plateau fracture  . Orif tibia plateau Right 04/01/2012    Procedure: TIBIAL PLATEAU/External Fixation;  Surgeon: Mable ParisJustin William Chandler, MD;  [facility];  Service: Orthopedics;  Laterality: Right;  . Orif femur fracture Left 04/06/2012    Procedure: OPEN REDUCTION INTERNAL FIXATION (ORIF) DISTAL FEMUR FRACTURE;  Surgeon: Budd PalmerMichael H Handy, MD;  Location: [facility] OR;  Service: Orthopedics;  Laterality: Left;  Biomet Proximal Tibia Plate, 7.3 Cannulated screws, Synthes small fragment set.  . Orif tibia plateau Right 04/14/2012    Procedure: OPEN REDUCTION INTERNAL FIXATION (ORIF) TIBIAL PLATEAU;  Surgeon: Budd PalmerMichael H Handy, MD;  Location: [facility] OR;  Service: Orthopedics;  Laterality: Right;   History reviewed. No pertinent family history. History  Substance Use Topics  . Smoking status: Current Every Day Smoker -- 1.50 packs/day    Types: Cigarettes  . Smokeless tobacco: Not on file  . Alcohol Use: 3.6 - 4.8 oz/week    6-8 Cans of beer per week   Comment: daily    Review of Systems  Constitutional: Negative for appetite change and fatigue.  HENT: Negative for congestion, ear discharge and sinus pressure.   Eyes: Negative for discharge.  Respiratory: Negative for cough.   Cardiovascular: Negative for chest pain.  Gastrointestinal: Negative for abdominal pain and diarrhea.  Genitourinary: Negative for frequency and hematuria.  Musculoskeletal: Positive for gait problem and joint swelling. Negative for back pain.  Skin: Negative for rash.  Neurological: Negative for seizures and headaches.  Psychiatric/Behavioral: Negative for hallucinations.      Allergies  Review of patient's allergies indicates no known allergies.  Home Medications   Prior to Admission medications   Medication Sig Start Date End Date Taking? Authorizing Provider  erythromycin (E-MYCIN) 250 MG tablet Take 2 tablets (500 mg total) by mouth 2 (two) times daily. 08/01/13  Yes Shari A Upstill, PA-C  oxyCODONE-acetaminophen (PERCOCET/ROXICET) 5-325 MG per tablet Take 1 tablet by mouth every 4 (four) hours as needed for moderate pain or severe pain. 08/07/13   Benny LennertJoseph L Tove Wideman, MD   BP 127/67  Pulse 73  Resp 13  SpO2 95% Physical Exam  Constitutional: He is oriented to person, place, and time. He appears well-developed.  HENT:  Head: Normocephalic.  Eyes: Conjunctivae and EOM are normal. No scleral icterus.  Neck: Neck supple. No thyromegaly present.  Cardiovascular: Normal rate and regular rhythm.  Exam reveals no gallop and no friction rub.  No murmur heard. Pulmonary/Chest: No stridor. He has no wheezes. He has no rales. He exhibits no tenderness.  Abdominal: He exhibits no distension. There is no tenderness. There is no rebound.  Musculoskeletal:  Tender swollen right  knee  Lymphadenopathy:    He has no cervical adenopathy.  Neurological: He is oriented to person, place, and time. He exhibits normal muscle tone. Coordination normal.  Skin: No rash  noted. No erythema.  Psychiatric: He has a normal mood and affect. His behavior is normal.    ED Course  Procedures (including critical care time) Labs Review Labs Reviewed  CBC WITH DIFFERENTIAL - Abnormal; Notable for the following:    WBC 11.3 (*)    All other components within normal limits  BASIC METABOLIC PANEL - Abnormal; Notable for the following:    Glucose, Bld 116 (*)    All other components within normal limits  PROTIME-INR    Imaging Review Dg Knee Complete 4 Views Right  08/07/2013   CLINICAL DATA:  Right knee pain after fall  EXAM: RIGHT KNEE - COMPLETE 4+ VIEW  COMPARISON:  None.  FINDINGS: There is a healed comminuted tibial plateau fracture transfixed by medial and lateral metallic sideplate and multiple interlocking screws. There there is persistent irregularity of the lateral tibial plateau articular surface.  There is a minimally displaced fracture of the lateral femoral condyle with the fracture cleft extending to the intercondylar notch with a lipohemarthrosis.  The soft tissues are unremarkable.  IMPRESSION: Minimally displaced fracture of the lateral femoral condyle with the fracture cleft extending to the intercondylar notch with a lipohemarthrosis.   Electronically Signed   By: Elige KoHetal  Patel   On: 08/07/2013 15:14     EKG Interpretation None      MDM   Final diagnoses:  Knee fracture, right, closed, initial encounter   Nondisplaced fx femur,  I spoke with Dr. Christian Mateaffery and either he or Dr. Carola FrostHandy will see the pt next week.  He rec.  Jones dressing and knee immobolizer    Benny LennertJoseph L Bronwen Pendergraft, MD 08/07/13 [phone]

## 2013-08-07 NOTE — Progress Notes (Signed)
Orthopedic Tech Progress Note Patient Details:  Marthenia RollingCharles Matzen 1960-07-04 [id number]  Ortho Devices Type of Ortho Device: Knee Immobilizer;Lenora BoysWatson Jones splint Ortho Device/Splint Location: rle Ortho Device/Splint Interventions: Application   Cesareo Vickrey 08/07/2013, 4:22 PM

## 2013-08-07 NOTE — ED Notes (Signed)
Slipped on water in kitchen and fell; twisted right knee and heard a snap and a pop. When EMS arrived, they reported him pale and in a lot of pain. Fentanyl 250 mcg given. (History of 4 surgeries on the same knee). Currently pain a 2/10. Right knee swollen; immobilized and iced for 20 minutes PTA.

## 2013-08-07 NOTE — Discharge Instructions (Signed)
Call dr. Carola FrostHandy Monday for follow up the beginning of next week.  If Dr. Carola FrostHandy is not available the call Dr. Christian Mateaffery

## 2013-08-11 ENCOUNTER — Inpatient Hospital Stay (HOSPITAL_COMMUNITY): Payer: No Typology Code available for payment source

## 2013-08-11 ENCOUNTER — Ambulatory Visit (HOSPITAL_COMMUNITY): Admission: RE | Admit: 2013-08-11 | Payer: No Typology Code available for payment source | Source: Ambulatory Visit

## 2013-08-11 ENCOUNTER — Inpatient Hospital Stay (HOSPITAL_COMMUNITY)
Admission: AD | Admit: 2013-08-11 | Discharge: 2013-08-14 | DRG: 481 | Disposition: A | Discharge status: Home Health | Payer: No Typology Code available for payment source | Source: Ambulatory Visit | Attending: Orthopedic Surgery | Admitting: Orthopedic Surgery

## 2013-08-11 ENCOUNTER — Encounter (HOSPITAL_COMMUNITY): Payer: Self-pay | Admitting: Orthopedic Surgery

## 2013-08-11 DIAGNOSIS — Y92009 Unspecified place in unspecified non-institutional (private) residence as the place of occurrence of the external cause: Secondary | ICD-10-CM

## 2013-08-11 DIAGNOSIS — E8881 Metabolic syndrome: Secondary | ICD-10-CM | POA: Diagnosis present

## 2013-08-11 DIAGNOSIS — S72413A Displaced unspecified condyle fracture of lower end of unspecified femur, initial encounter for closed fracture: Principal | ICD-10-CM | POA: Diagnosis present

## 2013-08-11 DIAGNOSIS — Z6837 Body mass index (BMI) 37.0-37.9, adult: Secondary | ICD-10-CM

## 2013-08-11 DIAGNOSIS — E785 Hyperlipidemia, unspecified: Secondary | ICD-10-CM | POA: Diagnosis present

## 2013-08-11 DIAGNOSIS — E119 Type 2 diabetes mellitus without complications: Secondary | ICD-10-CM | POA: Diagnosis present

## 2013-08-11 DIAGNOSIS — D62 Acute posthemorrhagic anemia: Secondary | ICD-10-CM | POA: Diagnosis not present

## 2013-08-11 DIAGNOSIS — I10 Essential (primary) hypertension: Secondary | ICD-10-CM | POA: Diagnosis present

## 2013-08-11 DIAGNOSIS — F17200 Nicotine dependence, unspecified, uncomplicated: Secondary | ICD-10-CM | POA: Diagnosis present

## 2013-08-11 DIAGNOSIS — E291 Testicular hypofunction: Secondary | ICD-10-CM | POA: Diagnosis present

## 2013-08-11 DIAGNOSIS — R7309 Other abnormal glucose: Secondary | ICD-10-CM | POA: Diagnosis present

## 2013-08-11 DIAGNOSIS — S83289A Other tear of lateral meniscus, current injury, unspecified knee, initial encounter: Secondary | ICD-10-CM | POA: Diagnosis present

## 2013-08-11 DIAGNOSIS — E669 Obesity, unspecified: Secondary | ICD-10-CM | POA: Diagnosis present

## 2013-08-11 DIAGNOSIS — S72409A Unspecified fracture of lower end of unspecified femur, initial encounter for closed fracture: Secondary | ICD-10-CM

## 2013-08-11 DIAGNOSIS — W010XXA Fall on same level from slipping, tripping and stumbling without subsequent striking against object, initial encounter: Secondary | ICD-10-CM | POA: Diagnosis present

## 2013-08-11 DIAGNOSIS — E559 Vitamin D deficiency, unspecified: Secondary | ICD-10-CM | POA: Diagnosis present

## 2013-08-11 DIAGNOSIS — M24059 Loose body in unspecified hip: Secondary | ICD-10-CM | POA: Diagnosis present

## 2013-08-11 HISTORY — DX: Vitamin D deficiency, unspecified: E55.9

## 2013-08-11 HISTORY — DX: Metabolic syndrome: E88.81

## 2013-08-11 HISTORY — DX: Testicular hypofunction: E29.1

## 2013-08-11 HISTORY — DX: Obesity, unspecified: E66.9

## 2013-08-11 HISTORY — DX: Nicotine dependence, unspecified, uncomplicated: F17.200

## 2013-08-11 HISTORY — DX: Metabolic syndrome: E88.810

## 2013-08-11 HISTORY — DX: Type 2 diabetes mellitus without complications: E11.9

## 2013-08-11 LAB — COMPREHENSIVE METABOLIC PANEL
ALT: 42 U/L (ref 0–53)
AST: 28 U/L (ref 0–37)
Albumin: 3.3 g/dL — ABNORMAL LOW (ref 3.5–5.2)
Alkaline Phosphatase: 61 U/L (ref 39–117)
Anion gap: 15 (ref 5–15)
BUN: 9 mg/dL (ref 6–23)
CALCIUM: 9.3 mg/dL (ref 8.4–10.5)
CO2: 27 mEq/L (ref 19–32)
Chloride: 97 mEq/L (ref 96–112)
Creatinine, Ser: 0.6 mg/dL (ref 0.50–1.35)
GFR calc non Af Amer: >90 mL/min (ref >90)
GLUCOSE: 165 mg/dL — AB (ref 70–99)
Potassium: 4 mEq/L (ref 3.7–5.3)
SODIUM: 139 meq/L (ref 137–147)
TOTAL PROTEIN: 7 g/dL (ref 6.0–8.3)
Total Bilirubin: 0.3 mg/dL (ref 0.3–1.2)

## 2013-08-11 LAB — URINALYSIS, ROUTINE W REFLEX MICROSCOPIC
Bilirubin Urine: NEGATIVE
Glucose, UA: 100 mg/dL — AB
Hgb urine dipstick: NEGATIVE
KETONES UR: NEGATIVE mg/dL
LEUKOCYTES UA: NEGATIVE
NITRITE: NEGATIVE
PROTEIN: 100 mg/dL — AB
Specific Gravity, Urine: 1.026 (ref 1.005–1.030)
UROBILINOGEN UA: 1 mg/dL (ref 0.0–1.0)
pH: 6.5 (ref 5.0–8.0)

## 2013-08-11 LAB — APTT: aPTT: 36 seconds (ref 24–37)

## 2013-08-11 LAB — CBC WITH DIFFERENTIAL/PLATELET
BASOS ABS: 0 10*3/uL (ref 0.0–0.1)
Basophils Relative: 0 % (ref 0–1)
Eosinophils Absolute: 0.2 10*3/uL (ref 0.0–0.7)
Eosinophils Relative: 2 % (ref 0–5)
HCT: 42.1 % (ref 39.0–52.0)
Hemoglobin: 14.3 g/dL (ref 13.0–17.0)
Lymphocytes Relative: 28 % (ref 12–46)
Lymphs Abs: 2.4 10*3/uL (ref 0.7–4.0)
MCH: 33.2 pg (ref 26.0–34.0)
MCHC: 34 g/dL (ref 30.0–36.0)
MCV: 97.7 fL (ref 78.0–100.0)
Monocytes Absolute: 0.6 10*3/uL (ref 0.1–1.0)
Monocytes Relative: 7 % (ref 3–12)
Neutro Abs: 5.5 10*3/uL (ref 1.7–7.7)
Neutrophils Relative %: 63 % (ref 43–77)
PLATELETS: 266 10*3/uL (ref 150–400)
RBC: 4.31 MIL/uL (ref 4.22–5.81)
RDW: 12.7 % (ref 11.5–15.5)
WBC: 8.8 10*3/uL (ref 4.0–10.5)

## 2013-08-11 LAB — URINE MICROSCOPIC-ADD ON

## 2013-08-11 LAB — HEMOGLOBIN A1C
HEMOGLOBIN A1C: 7.2 % — AB (ref <5.7)
Mean Plasma Glucose: 160 mg/dL — ABNORMAL HIGH (ref <117)

## 2013-08-11 LAB — PHOSPHORUS: Phosphorus: 4.4 mg/dL (ref 2.3–4.6)

## 2013-08-11 LAB — RAPID URINE DRUG SCREEN, HOSP PERFORMED
Amphetamines: NOT DETECTED
Barbiturates: NOT DETECTED
Benzodiazepines: NOT DETECTED
Cocaine: NOT DETECTED
OPIATES: POSITIVE — AB
Tetrahydrocannabinol: NOT DETECTED

## 2013-08-11 LAB — PROTIME-INR
INR: 0.99 (ref 0.00–1.49)
PROTHROMBIN TIME: 13.1 s (ref 11.6–15.2)

## 2013-08-11 LAB — MAGNESIUM: Magnesium: 2.1 mg/dL (ref 1.5–2.5)

## 2013-08-11 LAB — SURGICAL PCR SCREEN
MRSA, PCR: NEGATIVE
Staphylococcus aureus: NEGATIVE

## 2013-08-11 MED ORDER — BISACODYL 5 MG PO TBEC: 5.0000 mg | DELAYED_RELEASE_TABLET | Freq: Every day | ORAL | Status: DC | PRN

## 2013-08-11 MED ORDER — POLYETHYLENE GLYCOL 3350 17 G PO PACK: 17.0000 g | PACK | Freq: Every day | ORAL | Status: DC | PRN

## 2013-08-11 MED ORDER — PROMETHAZINE HCL 25 MG PO TABS: 12.5000 mg | ORAL_TABLET | Freq: Four times a day (QID) | ORAL | Status: DC | PRN

## 2013-08-11 MED ORDER — DOCUSATE SODIUM 100 MG PO CAPS: 100.0000 mg | ORAL_CAPSULE | Freq: Two times a day (BID) | ORAL | Status: DC

## 2013-08-11 MED ORDER — CHLORHEXIDINE GLUCONATE 4 % EX LIQD
60.0000 mL | Freq: Once | CUTANEOUS | Status: DC
  Filled 2013-08-11 (×2): qty 60

## 2013-08-11 MED ORDER — HYDROMORPHONE HCL PF 1 MG/ML IJ SOLN: 1.0000 mg | INTRAMUSCULAR | Status: DC | PRN

## 2013-08-11 MED ORDER — OXYCODONE HCL 5 MG PO TABS
5.0000 mg | ORAL_TABLET | ORAL | Status: DC | PRN
  Administered 2013-08-11 – 2013-08-14 (×2): 10 mg via ORAL
  Filled 2013-08-11 (×2): qty 2

## 2013-08-11 MED ORDER — ALUM & MAG HYDROXIDE-SIMETH 200-200-20 MG/5ML PO SUSP: 30.0000 mL | Freq: Four times a day (QID) | ORAL | Status: DC | PRN

## 2013-08-11 MED ORDER — CEFAZOLIN SODIUM-DEXTROSE 2-3 GM-% IV SOLR
2.0000 g | INTRAVENOUS | Status: AC
  Administered 2013-08-12: 2 g via INTRAVENOUS
  Filled 2013-08-11: qty 50

## 2013-08-11 MED ORDER — DOCUSATE SODIUM 100 MG PO CAPS
100.0000 mg | ORAL_CAPSULE | Freq: Two times a day (BID) | ORAL | Status: DC
  Administered 2013-08-11 – 2013-08-13 (×4): 100 mg via ORAL
  Filled 2013-08-11 (×6): qty 1

## 2013-08-11 MED ORDER — OXYCODONE-ACETAMINOPHEN 5-325 MG PO TABS
1.0000 | ORAL_TABLET | Freq: Four times a day (QID) | ORAL | Status: DC | PRN
  Administered 2013-08-11 – 2013-08-13 (×7): 2 via ORAL
  Filled 2013-08-11 (×7): qty 2

## 2013-08-11 MED ORDER — OXYCODONE HCL 5 MG PO TABS: 5.0000 mg | ORAL_TABLET | ORAL | Status: DC | PRN

## 2013-08-11 MED ORDER — OXYCODONE-ACETAMINOPHEN 5-325 MG PO TABS: 1.0000 | ORAL_TABLET | Freq: Four times a day (QID) | ORAL | Status: DC | PRN

## 2013-08-11 MED ORDER — LACTATED RINGERS IV SOLN
INTRAVENOUS | Status: DC
  Administered 2013-08-11: 75 mL/h via INTRAVENOUS

## 2013-08-11 NOTE — H&P (Signed)
[facility] H&P  Chief Complaint:  R distal femur fx  HPI:   53 y/o white male well known to OTS sustained a fall at home on 08/07/2013 when he slipped on his wet kitchen floor. Pt had immediate onset of pain in R leg and inability to bear weight. He was brought to [facility] hospital by EMS and was found to have a R lateral femoral condyle fracture.  Pts case was reviewed with on call surgeon and it was deemed safe for him to discharge and follow up as an outpt.  Pt followed up with OTS on 08/11/2013, his fracture pattern was felt to be surgical and pt was admitted from office to hospital in preparation for surgery.   Past Medical History  Diagnosis Date  . Unspecified vitamin D deficiency 04/03/2012  . 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 Obesity, unspecified 04/03/2012  . Nicotine dependence 04/03/2012  . Pre-diabetes     Past Surgical History  Procedure Laterality Date  . Ankle fracture surgery  2010  . External fixation leg  04/01/2012    ex fix R tibial plateau fracture  . Orif tibia plateau Right 04/01/2012    Procedure: TIBIAL PLATEAU/External Fixation;  Surgeon: Nita Sells, MD;  Location: [place];  Service: Orthopedics;  Laterality: Right;  . Orif femur fracture Left 04/06/2012    Procedure: OPEN REDUCTION INTERNAL FIXATION (ORIF) DISTAL FEMUR FRACTURE;  Surgeon: Rozanna Box, MD;  Location: [place];  Service: Orthopedics;  Laterality: Left;  Biomet Proximal Tibia Plate, 7.3 Cannulated screws, Synthes small fragment set.  . Orif tibia plateau Right 04/14/2012    Procedure: OPEN REDUCTION INTERNAL FIXATION (ORIF) TIBIAL PLATEAU;  Surgeon: Rozanna Box, MD;  Location: [place];  Service: Orthopedics;  Laterality: Right;    History reviewed. No pertinent family history. Social History:  reports that he has been smoking Cigarettes.  He has a 45 pack-year smoking history. He has never used smokeless tobacco. He reports that he drinks about 3.6 - 4.8 ounces of alcohol  per week. He reports that he does not use illicit drugs.  Allergies: No Known Allergies  Medications Prior to Admission  Medication Sig Dispense Refill  . erythromycin (E-MYCIN) 250 MG tablet Take 2 tablets (500 mg total) by mouth 2 (two) times daily.  40 tablet  0  . HYDROcodone-acetaminophen (NORCO/VICODIN) 5-325 MG per tablet Take 1 tablet by mouth every 4 (four) hours as needed for moderate pain or severe pain.  30 tablet  0    Results for orders placed during the hospital encounter of 08/11/13 (from the past 48 hour(s))  HEMOGLOBIN A1C     Status: Abnormal   Collection Time    08/11/13  1:00 PM      Result Value Ref Range   Hemoglobin A1C 7.2 (*) <5.7 %   Comment: (NOTE)                                                                               According to the ADA Clinical Practice Recommendations for 2011, when     HbA1c is used as a screening test:      >=6.5%   Diagnostic of Diabetes Mellitus               (  if abnormal result is confirmed)     5.7-6.4%   Increased risk of developing Diabetes Mellitus     References:Diagnosis and Classification of Diabetes Mellitus,Diabetes     PJAS,5053,97(QBHAL 1):S62-S69 and Standards of Medical Care in             Diabetes - 2011,Diabetes PFXT,0240,97 (Suppl 1):S11-S61.   Mean Plasma Glucose 160 (*) <117 mg/dL   Comment: Performed at [facility]     Status: Abnormal   Collection Time    08/11/13  1:00 PM      Result Value Ref Range   Sodium 139  137 - 147 mEq/L   Potassium 4.0  3.7 - 5.3 mEq/L   Chloride 97  96 - 112 mEq/L   CO2 27  19 - 32 mEq/L   Glucose, Bld 165 (*) 70 - 99 mg/dL   BUN 9  6 - 23 mg/dL   Creatinine, Ser 0.60  0.50 - 1.35 mg/dL   Calcium 9.3  8.4 - 10.5 mg/dL   Total Protein 7.0  6.0 - 8.3 g/dL   Albumin 3.3 (*) 3.5 - 5.2 g/dL   AST 28  0 - 37 U/L   ALT 42  0 - 53 U/L   Alkaline Phosphatase 61  39 - 117 U/L   Total Bilirubin 0.3  0.3 - 1.2 mg/dL   GFR calc non Af  Amer >90  >90 mL/min   GFR calc Af Amer >90  >90 mL/min   Comment: (NOTE)     The eGFR has been calculated using the CKD EPI equation.     This calculation has not been validated in all clinical situations.     eGFR's persistently <90 mL/min signify possible Chronic Kidney     Disease.   Anion gap 15  5 - 15  MAGNESIUM     Status: None   Collection Time    08/11/13  1:00 PM      Result Value Ref Range   Magnesium 2.1  1.5 - 2.5 mg/dL  PHOSPHORUS     Status: None   Collection Time    08/11/13  1:00 PM      Result Value Ref Range   Phosphorus 4.4  2.3 - 4.6 mg/dL  CBC WITH DIFFERENTIAL     Status: None   Collection Time    08/11/13  1:00 PM      Result Value Ref Range   WBC 8.8  4.0 - 10.5 K/uL   RBC 4.31  4.22 - 5.81 MIL/uL   Hemoglobin 14.3  13.0 - 17.0 g/dL   HCT 42.1  39.0 - 52.0 %   MCV 97.7  78.0 - 100.0 fL   MCH 33.2  26.0 - 34.0 pg   MCHC 34.0  30.0 - 36.0 g/dL   RDW 12.7  11.5 - 15.5 %   Platelets 266  150 - 400 K/uL   Neutrophils Relative % 63  43 - 77 %   Neutro Abs 5.5  1.7 - 7.7 K/uL   Lymphocytes Relative 28  12 - 46 %   Lymphs Abs 2.4  0.7 - 4.0 K/uL   Monocytes Relative 7  3 - 12 %   Monocytes Absolute 0.6  0.1 - 1.0 K/uL   Eosinophils Relative 2  0 - 5 %   Eosinophils Absolute 0.2  0.0 - 0.7 K/uL   Basophils Relative 0  0 - 1 %   Basophils Absolute 0.0  0.0 -  0.1 K/uL  PROTIME-INR     Status: None   Collection Time    08/11/13  1:00 PM      Result Value Ref Range   Prothrombin Time 13.1  11.6 - 15.2 seconds   INR 0.99  0.00 - 1.49  APTT     Status: None   Collection Time    08/11/13  1:00 PM      Result Value Ref Range   aPTT 36  24 - 37 seconds  URINALYSIS, ROUTINE W REFLEX MICROSCOPIC     Status: Abnormal   Collection Time    08/11/13  4:48 PM      Result Value Ref Range   Color, Urine YELLOW  YELLOW   APPearance HAZY (*) CLEAR   Specific Gravity, Urine 1.026  1.005 - 1.030   pH 6.5  5.0 - 8.0   Glucose, UA 100 (*) NEGATIVE mg/dL   Hgb  urine dipstick NEGATIVE  NEGATIVE   Bilirubin Urine NEGATIVE  NEGATIVE   Ketones, ur NEGATIVE  NEGATIVE mg/dL   Protein, ur 100 (*) NEGATIVE mg/dL   Urobilinogen, UA 1.0  0.0 - 1.0 mg/dL   Nitrite NEGATIVE  NEGATIVE   Leukocytes, UA NEGATIVE  NEGATIVE  URINE RAPID DRUG SCREEN (HOSP PERFORMED)     Status: Abnormal   Collection Time    08/11/13  4:48 PM      Result Value Ref Range   Opiates POSITIVE (*) NONE DETECTED   Cocaine NONE DETECTED  NONE DETECTED   Benzodiazepines NONE DETECTED  NONE DETECTED   Amphetamines NONE DETECTED  NONE DETECTED   Tetrahydrocannabinol NONE DETECTED  NONE DETECTED   Barbiturates NONE DETECTED  NONE DETECTED   Comment:            DRUG SCREEN FOR MEDICAL PURPOSES     ONLY.  IF CONFIRMATION IS NEEDED     FOR ANY PURPOSE, NOTIFY LAB     WITHIN 5 DAYS.                LOWEST DETECTABLE LIMITS     FOR URINE DRUG SCREEN     Drug Class       Cutoff (ng/mL)     Amphetamine      1000     Barbiturate      200     Benzodiazepine   161     Tricyclics       096     Opiates          300     Cocaine          300     THC              50  URINE MICROSCOPIC-ADD ON     Status: Abnormal   Collection Time    08/11/13  4:48 PM      Result Value Ref Range   Squamous Epithelial / LPF FEW (*) RARE   WBC, UA 0-2  <3 WBC/hpf   Bacteria, UA RARE  RARE   Urine-Other MUCOUS PRESENT     Comment: AMORPHOUS URATES/PHOSPHATES   Ct Knee Right Wo Contrast  08/11/2013   CLINICAL DATA:  Pain and swelling of the knee. Distal femur fracture. Tibial plateau fracture.  EXAM: CT OF THE RIGHT KNEE WITHOUT CONTRAST  TECHNIQUE: Multidetector CT imaging of the RIGHT knee was performed according to the standard protocol. Multiplanar CT image reconstructions were also generated.  COMPARISON:  04/02/2012.  FINDINGS: Diffuse osteopenia is  present. Hemarthrosis of the knee is present associated with an acute obliquely oriented medial distal femur fracture. This extends from the anterior medial  metaphysis through the medial femoral condyle and into the medial margin of the intercondylar notch. Fracture is minimally displaced. Maximal displacement is 2 mm. Fracture extends into the weight-bearing surface of the medial femoral condyle (image 37 series 2010).  The lateral femoral condyle appears intact. Multiple loose bodies are present in the joint with the largest in the anterior medial compartment measuring 2 cm transverse, 1 cm AP and 7 mm craniocaudal. Calcification is present posterior to the medial compartment, probably extra-articular an representing heterotopic bone. Tibial plateau fracture with medial buttress plate and lateral buttress plate and screw fixation. No recurrent tibial fracture. The tibial fracture is healed. There is persisting cortical irregularity of the medial femoral condyle weight-bearing surface and to a lesser extent the lateral femoral condyle. Central osteophytes are present in the medial PA and lateral weight-bearing femoral condyles. Moderate patellofemoral osteoarthritis. Diffuse edema is present around the knee. Scarring associated with buttress plate and screw fixation.  IMPRESSION: 1. Acute oblique nondisplaced fracture of the distal femur with intra-articular extension into the medial femoral condyle weight-bearing surface. 2. Healed comminuted tibial plateau fracture with medial and lateral buttress plate and screw fixation.   Electronically Signed   By: Dereck Ligas M.D.   On: 08/11/2013 13:19   Portable Chest 1 View  08/11/2013   CLINICAL DATA:  Right knee fracture.  Preoperative respiratory exam.  EXAM: PORTABLE CHEST - 1 VIEW  COMPARISON:  04/01/2012  FINDINGS: Stable mild cardiomegaly. Stable mild elevation of the right hemidiaphragm. There is no evidence of pulmonary edema, consolidation, pneumothorax, nodule or pleural fluid. No bony abnormalities are seen.  IMPRESSION: Stable mild cardiomegaly.  No acute findings.   Electronically Signed   By: Aletta Edouard M.D.   On: 08/11/2013 11:15    Review of Systems  Constitutional: Negative for fever and chills.  Respiratory: Negative for shortness of breath.   Cardiovascular: Negative for chest pain and palpitations.  Gastrointestinal: Negative for nausea, vomiting and abdominal pain.  Musculoskeletal:       R knee pain     Blood pressure 155/80, pulse 78, temperature 98 F (36.7 C), temperature source Oral, resp. rate 18, SpO2 95.00%. Physical Exam  Constitutional: He is oriented to person, place, and time. He is cooperative. No distress.  Eyes: EOM are normal. Pupils are equal, round, and reactive to light.  Neck: Normal range of motion. No spinous process tenderness and no muscular tenderness present.  Cardiovascular: Normal rate, regular rhythm, S1 normal and S2 normal.   Respiratory: Effort normal and breath sounds normal.  GI: Soft. Normal appearance and bowel sounds are normal. He exhibits no distension. There is no tenderness.  Musculoskeletal:  Right Lower Extremity   Old healed surgical wounds R Knee   TTP distal femur   + effusion    Distal motor and sensory functions grossly intact   Ext warm    + DP pulse     Neurological: He is alert and oriented to person, place, and time.  Skin: Skin is warm and intact.     Assessment/Plan  53 y/o male s/p ground level fall at home with R lateral femoral condyle fracture  Admit in preparation for OR  OR on 08/12/2013 for ORIF R lateral femoral condyle  Pt will be NWB x 6-8 weeks, unrestricted ROM  Will recheck bone health factors again Took pt  almost a year to follow up with endocrinology after first accident and based on chart it appears pts has not had any subsequent follow up or even went for laboratory testing that endo requested Also based on pts HgbA1C it appears he is diabetic. Will need outpt follow up with PCP   Diabetes also contributing to poor bone quality  Anticipate 1-2 day hospital stay post op with discharge to  home   Jari Pigg, PA-C [facility] [phone] (P) 08/11/2013, 10:58 PM

## 2013-08-12 ENCOUNTER — Inpatient Hospital Stay (HOSPITAL_COMMUNITY)
Admission: RE | Admit: 2013-08-12 | Payer: No Typology Code available for payment source | Source: Ambulatory Visit | Admitting: Orthopedic Surgery

## 2013-08-12 ENCOUNTER — Inpatient Hospital Stay (HOSPITAL_COMMUNITY): Payer: No Typology Code available for payment source | Admitting: Anesthesiology

## 2013-08-12 ENCOUNTER — Encounter (HOSPITAL_COMMUNITY)
Admission: AD | Disposition: A | Discharge status: Home Health | Payer: Self-pay | Source: Ambulatory Visit | Attending: Orthopedic Surgery

## 2013-08-12 ENCOUNTER — Encounter (HOSPITAL_COMMUNITY): Payer: Self-pay | Admitting: Anesthesiology

## 2013-08-12 ENCOUNTER — Inpatient Hospital Stay (HOSPITAL_COMMUNITY): Payer: No Typology Code available for payment source

## 2013-08-12 ENCOUNTER — Encounter (HOSPITAL_COMMUNITY): Payer: No Typology Code available for payment source | Admitting: Anesthesiology

## 2013-08-12 DIAGNOSIS — R7309 Other abnormal glucose: Secondary | ICD-10-CM | POA: Diagnosis present

## 2013-08-12 HISTORY — PX: ORIF FEMUR FRACTURE: SHX2119

## 2013-08-12 LAB — LUTEINIZING HORMONE: LH: 1.7 m[IU]/mL (ref 1.5–9.3)

## 2013-08-12 LAB — VITAMIN D 25 HYDROXY (VIT D DEFICIENCY, FRACTURES): VIT D 25 HYDROXY: 44 ng/mL (ref 30–89)

## 2013-08-12 LAB — CREATININE, SERUM
CREATININE: 0.64 mg/dL (ref 0.50–1.35)
GFR calc Af Amer: >90 mL/min (ref >90)
GFR calc non Af Amer: >90 mL/min (ref >90)

## 2013-08-12 LAB — PTH, INTACT AND CALCIUM
Calcium, Total (PTH): 9.1 mg/dL (ref 8.4–10.5)
PTH: 32.7 pg/mL (ref 14.0–72.0)

## 2013-08-12 LAB — TESTOSTERONE: Testosterone: 136 ng/dL — ABNORMAL LOW (ref 300–890)

## 2013-08-12 LAB — CBC
HEMATOCRIT: 43.7 % (ref 39.0–52.0)
Hemoglobin: 14.1 g/dL (ref 13.0–17.0)
MCH: 32.7 pg (ref 26.0–34.0)
MCHC: 32.3 g/dL (ref 30.0–36.0)
MCV: 101.4 fL — ABNORMAL HIGH (ref 78.0–100.0)
Platelets: 263 10*3/uL (ref 150–400)
RBC: 4.31 MIL/uL (ref 4.22–5.81)
RDW: 12.8 % (ref 11.5–15.5)
WBC: 10.6 10*3/uL — ABNORMAL HIGH (ref 4.0–10.5)

## 2013-08-12 LAB — TSH: TSH: 4.27 u[IU]/mL (ref 0.350–4.500)

## 2013-08-12 LAB — FOLLICLE STIMULATING HORMONE: FSH: 4.2 m[IU]/mL (ref 1.4–18.1)

## 2013-08-12 LAB — GLUCOSE, CAPILLARY
GLUCOSE-CAPILLARY: 149 mg/dL — AB (ref 70–99)
GLUCOSE-CAPILLARY: 161 mg/dL — AB (ref 70–99)
Glucose-Capillary: 162 mg/dL — ABNORMAL HIGH (ref 70–99)

## 2013-08-12 LAB — PROLACTIN: Prolactin: 12 ng/mL (ref 2.1–17.1)

## 2013-08-12 SURGERY — OPEN REDUCTION INTERNAL FIXATION (ORIF) DISTAL FEMUR FRACTURE
Anesthesia: General | Site: Leg Upper | Laterality: Right

## 2013-08-12 MED ORDER — EPHEDRINE SULFATE 50 MG/ML IJ SOLN
INTRAMUSCULAR | Status: DC | PRN
  Administered 2013-08-12 (×2): 10 mg via INTRAVENOUS

## 2013-08-12 MED ORDER — METOCLOPRAMIDE HCL 5 MG PO TABS: 5.0000 mg | ORAL_TABLET | Freq: Three times a day (TID) | ORAL | Status: DC | PRN

## 2013-08-12 MED ORDER — POTASSIUM CHLORIDE IN NACL 20-0.9 MEQ/L-% IV SOLN
INTRAVENOUS | Status: DC
  Administered 2013-08-12: 14:00:00 via INTRAVENOUS
  Filled 2013-08-12 (×2): qty 1000

## 2013-08-12 MED ORDER — METHOCARBAMOL 500 MG PO TABS
ORAL_TABLET | ORAL | Status: AC
  Filled 2013-08-12: qty 1

## 2013-08-12 MED ORDER — MIDAZOLAM HCL 5 MG/5ML IJ SOLN
INTRAMUSCULAR | Status: DC | PRN
  Administered 2013-08-12: 2 mg via INTRAVENOUS

## 2013-08-12 MED ORDER — METHOCARBAMOL 500 MG PO TABS
1000.0000 mg | ORAL_TABLET | Freq: Four times a day (QID) | ORAL | Status: DC | PRN
  Administered 2013-08-12 – 2013-08-13 (×4): 1000 mg via ORAL
  Filled 2013-08-12 (×4): qty 2

## 2013-08-12 MED ORDER — SODIUM CHLORIDE 0.9 % IJ SOLN
INTRAMUSCULAR | Status: DC | PRN
  Administered 2013-08-12: 20 mL

## 2013-08-12 MED ORDER — HYDROMORPHONE HCL PF 1 MG/ML IJ SOLN
0.2500 mg | INTRAMUSCULAR | Status: DC | PRN
  Administered 2013-08-12 (×3): 0.5 mg via INTRAVENOUS

## 2013-08-12 MED ORDER — FENTANYL CITRATE 0.05 MG/ML IJ SOLN
INTRAMUSCULAR | Status: DC | PRN
  Administered 2013-08-12: 150 ug via INTRAVENOUS
  Administered 2013-08-12 (×2): 50 ug via INTRAVENOUS

## 2013-08-12 MED ORDER — HYDROMORPHONE HCL PF 1 MG/ML IJ SOLN
0.5000 mg | INTRAMUSCULAR | Status: DC | PRN
  Administered 2013-08-12 – 2013-08-13 (×3): 1 mg via INTRAVENOUS
  Filled 2013-08-12 (×3): qty 1

## 2013-08-12 MED ORDER — LIDOCAINE HCL 4 % MT SOLN
OROMUCOSAL | Status: DC | PRN
  Administered 2013-08-12: 4 mL via TOPICAL

## 2013-08-12 MED ORDER — LIVING WELL WITH DIABETES BOOK
Freq: Once | Status: AC
  Administered 2013-08-12: 14:00:00
  Filled 2013-08-12: qty 1

## 2013-08-12 MED ORDER — ONDANSETRON HCL 4 MG/2ML IJ SOLN
4.0000 mg | Freq: Four times a day (QID) | INTRAMUSCULAR | Status: DC | PRN
  Administered 2013-08-12: 4 mg via INTRAVENOUS
  Filled 2013-08-12: qty 2

## 2013-08-12 MED ORDER — NEOSTIGMINE METHYLSULFATE 10 MG/10ML IV SOLN
INTRAVENOUS | Status: DC | PRN
  Administered 2013-08-12: 4 mg via INTRAVENOUS

## 2013-08-12 MED ORDER — ENOXAPARIN SODIUM 40 MG/0.4ML ~~LOC~~ SOLN
40.0000 mg | SUBCUTANEOUS | Status: DC
  Administered 2013-08-13: 40 mg via SUBCUTANEOUS
  Filled 2013-08-12 (×2): qty 0.4

## 2013-08-12 MED ORDER — LACTATED RINGERS IV SOLN
INTRAVENOUS | Status: DC | PRN
  Administered 2013-08-12: 07:00:00 via INTRAVENOUS

## 2013-08-12 MED ORDER — HYDROMORPHONE HCL PF 1 MG/ML IJ SOLN
INTRAMUSCULAR | Status: AC
  Filled 2013-08-12: qty 2

## 2013-08-12 MED ORDER — ONDANSETRON HCL 4 MG PO TABS: 4.0000 mg | ORAL_TABLET | Freq: Four times a day (QID) | ORAL | Status: DC | PRN

## 2013-08-12 MED ORDER — ONDANSETRON HCL 4 MG/2ML IJ SOLN
4.0000 mg | Freq: Once | INTRAMUSCULAR | Status: AC | PRN
  Administered 2013-08-12: 4 mg via INTRAVENOUS

## 2013-08-12 MED ORDER — LIDOCAINE HCL (CARDIAC) 20 MG/ML IV SOLN
INTRAVENOUS | Status: DC | PRN
  Administered 2013-08-12: 100 mg via INTRAVENOUS

## 2013-08-12 MED ORDER — METHOCARBAMOL 500 MG PO TABS
ORAL_TABLET | ORAL | Status: AC
Start: 2013-08-12 — End: 2013-08-13
  Filled 2013-08-12: qty 1

## 2013-08-12 MED ORDER — PROPOFOL 10 MG/ML IV BOLUS
INTRAVENOUS | Status: DC | PRN
  Administered 2013-08-12: 340 mg via INTRAVENOUS

## 2013-08-12 MED ORDER — ONDANSETRON HCL 4 MG/2ML IJ SOLN
INTRAMUSCULAR | Status: DC | PRN
  Administered 2013-08-12: 4 mg via INTRAVENOUS

## 2013-08-12 MED ORDER — 0.9 % SODIUM CHLORIDE (POUR BTL) OPTIME
TOPICAL | Status: DC | PRN
  Administered 2013-08-12: 1000 mL

## 2013-08-12 MED ORDER — ROCURONIUM BROMIDE 100 MG/10ML IV SOLN
INTRAVENOUS | Status: DC | PRN
  Administered 2013-08-12 (×2): 10 mg via INTRAVENOUS
  Administered 2013-08-12: 50 mg via INTRAVENOUS
  Administered 2013-08-12: 30 mg via INTRAVENOUS

## 2013-08-12 MED ORDER — BUPIVACAINE LIPOSOME 1.3 % IJ SUSP
INTRAMUSCULAR | Status: DC | PRN
  Administered 2013-08-12: 20 mL

## 2013-08-12 MED ORDER — METHOCARBAMOL 1000 MG/10ML IJ SOLN
1000.0000 mg | Freq: Four times a day (QID) | INTRAVENOUS | Status: DC | PRN
  Filled 2013-08-12: qty 10

## 2013-08-12 MED ORDER — BUPIVACAINE LIPOSOME 1.3 % IJ SUSP
20.0000 mL | Freq: Once | INTRAMUSCULAR | Status: DC
  Filled 2013-08-12: qty 20

## 2013-08-12 MED ORDER — METOCLOPRAMIDE HCL 5 MG/ML IJ SOLN: 5.0000 mg | Freq: Three times a day (TID) | INTRAMUSCULAR | Status: DC | PRN

## 2013-08-12 MED ORDER — GLYCOPYRROLATE 0.2 MG/ML IJ SOLN
INTRAMUSCULAR | Status: DC | PRN
  Administered 2013-08-12: 0.6 mg via INTRAVENOUS

## 2013-08-12 MED ORDER — CEFAZOLIN SODIUM 1-5 GM-% IV SOLN
1.0000 g | Freq: Four times a day (QID) | INTRAVENOUS | Status: AC
  Administered 2013-08-12 – 2013-08-13 (×3): 1 g via INTRAVENOUS
  Filled 2013-08-12 (×4): qty 50

## 2013-08-12 SURGICAL SUPPLY — 76 items
2.8X300 GUIDEPINE ×4 IMPLANT
BANDAGE ELASTIC 4 VELCRO ST LF (GAUZE/BANDAGES/DRESSINGS) ×3 IMPLANT
BANDAGE ELASTIC 6 VELCRO ST LF (GAUZE/BANDAGES/DRESSINGS) ×3 IMPLANT
BANDAGE GAUZE ELAST BULKY 4 IN (GAUZE/BANDAGES/DRESSINGS) ×3 IMPLANT
BIT DRILL 2.5 X LONG (BIT) ×1
BIT DRILL PERC QC 2.8X200 100 (BIT) IMPLANT
BIT DRILL QC 3.5X110 (BIT) ×2 IMPLANT
BIT DRILL X LONG 2.5 (BIT) IMPLANT
BLADE SURG ROTATE 9660 (MISCELLANEOUS) IMPLANT
BRUSH SCRUB DISP (MISCELLANEOUS) ×6 IMPLANT
CANISTER SUCT 3000ML (MISCELLANEOUS) ×3 IMPLANT
COVER SURGICAL LIGHT HANDLE (MISCELLANEOUS) ×3 IMPLANT
DRAPE C-ARM 42X72 X-RAY (DRAPES) ×3 IMPLANT
DRAPE C-ARMOR (DRAPES) ×3 IMPLANT
DRAPE EXTREMITY T 121X128X90 (DRAPE) ×2 IMPLANT
DRAPE ORTHO SPLIT 77X108 STRL (DRAPES)
DRAPE SURG ORHT 6 SPLT 77X108 (DRAPES) ×3 IMPLANT
DRAPE U-SHAPE 47X51 STRL (DRAPES) ×3 IMPLANT
DRILL BIT ×2 IMPLANT
DRILL BIT QUICK COUP 2.8MM 100 (BIT) ×2
DRILL BIT X LONG 2.5 (BIT) ×3
DRSG ADAPTIC 3X8 NADH LF (GAUZE/BANDAGES/DRESSINGS) ×3 IMPLANT
DRSG PAD ABDOMINAL 8X10 ST (GAUZE/BANDAGES/DRESSINGS) ×12 IMPLANT
ELECT REM PT RETURN 9FT ADLT (ELECTROSURGICAL) ×3
ELECTRODE REM PT RTRN 9FT ADLT (ELECTROSURGICAL) ×1 IMPLANT
EVACUATOR 1/8 PVC DRAIN (DRAIN) IMPLANT
EVACUATOR 3/16  PVC DRAIN (DRAIN)
EVACUATOR 3/16 PVC DRAIN (DRAIN) IMPLANT
GLOVE BIO SURGEON STRL SZ7.5 (GLOVE) ×3 IMPLANT
GLOVE BIO SURGEON STRL SZ8 (GLOVE) ×3 IMPLANT
GLOVE BIOGEL PI IND STRL 7.5 (GLOVE) ×1 IMPLANT
GLOVE BIOGEL PI IND STRL 8 (GLOVE) ×1 IMPLANT
GLOVE BIOGEL PI INDICATOR 7.5 (GLOVE) ×2
GLOVE BIOGEL PI INDICATOR 8 (GLOVE) ×2
GOWN STRL REUS W/ TWL LRG LVL3 (GOWN DISPOSABLE) ×2 IMPLANT
GOWN STRL REUS W/ TWL XL LVL3 (GOWN DISPOSABLE) ×1 IMPLANT
GOWN STRL REUS W/TWL LRG LVL3 (GOWN DISPOSABLE) ×6
GOWN STRL REUS W/TWL XL LVL3 (GOWN DISPOSABLE) ×3
GUIDEPIN 2.8X300 THRD TIP OIC (Screw) ×4 IMPLANT
KIT BASIN OR (CUSTOM PROCEDURE TRAY) ×3 IMPLANT
KIT ROOM TURNOVER OR (KITS) ×3 IMPLANT
NDL SPNL 18GX3.5 QUINCKE PK (NEEDLE) IMPLANT
NEEDLE 22X1 1/2 (OR ONLY) (NEEDLE) IMPLANT
NEEDLE SPNL 18GX3.5 QUINCKE PK (NEEDLE) ×3 IMPLANT
NS IRRIG 1000ML POUR BTL (IV SOLUTION) ×3 IMPLANT
PACK TOTAL JOINT (CUSTOM PROCEDURE TRAY) ×3 IMPLANT
PAD ARMBOARD 7.5X6 YLW CONV (MISCELLANEOUS) ×6 IMPLANT
PAD CAST 4YDX4 CTTN HI CHSV (CAST SUPPLIES) ×1 IMPLANT
PADDING CAST COTTON 4X4 STRL (CAST SUPPLIES) ×3
PADDING CAST COTTON 6X4 STRL (CAST SUPPLIES) ×3 IMPLANT
PLATE LCP RECON 3.5 5H/70 (Plate) ×2 IMPLANT
SCREW CANN 7.3X80 32MM THRD (Screw) ×2 IMPLANT
SCREW CANN 7.3X85 32MM THRD (Screw) ×4 IMPLANT
SCREW CORTEX 3.5 50MM (Screw) ×2 IMPLANT
SCREW PELVIC CORT ST 3.5X60 (Screw) IMPLANT
SCREW PELVIC CORT ST 3.5X85 (Screw) IMPLANT
SCREW SELF TAP 3.5 60M (Screw) ×2 IMPLANT
SCREW SELF TAP 3.5 85M (Screw) ×2 IMPLANT
SPONGE GAUZE 4X4 12PLY (GAUZE/BANDAGES/DRESSINGS) ×3 IMPLANT
SPONGE LAP 18X18 X RAY DECT (DISPOSABLE) ×1 IMPLANT
STAPLER VISISTAT 35W (STAPLE) ×3 IMPLANT
SUCTION FRAZIER TIP 10 FR DISP (SUCTIONS) ×3 IMPLANT
SUT PROLENE 0 CT 2 (SUTURE) IMPLANT
SUT VIC AB 0 CT1 27 (SUTURE) ×6
SUT VIC AB 0 CT1 27XBRD ANBCTR (SUTURE) ×2 IMPLANT
SUT VIC AB 1 CT1 27 (SUTURE) ×6
SUT VIC AB 1 CT1 27XBRD ANBCTR (SUTURE) ×2 IMPLANT
SUT VIC AB 2-0 CT1 27 (SUTURE) ×6
SUT VIC AB 2-0 CT1 TAPERPNT 27 (SUTURE) ×2 IMPLANT
SYR 20ML ECCENTRIC (SYRINGE) IMPLANT
SYRINGE 60CC LL (MISCELLANEOUS) ×2 IMPLANT
TOWEL OR 17X24 6PK STRL BLUE (TOWEL DISPOSABLE) ×3 IMPLANT
TOWEL OR 17X26 10 PK STRL BLUE (TOWEL DISPOSABLE) ×6 IMPLANT
TRAY FOLEY CATH 16FRSI W/METER (SET/KITS/TRAYS/PACK) IMPLANT
WASHER OIC 13MM 6 PACK (Screw) ×2 IMPLANT
WATER STERILE IRR 1000ML POUR (IV SOLUTION) ×6 IMPLANT

## 2013-08-12 NOTE — Progress Notes (Signed)
Inpatient Diabetes Program Recommendations  AACE/ADA: New Consensus Statement on Inpatient Glycemic Control (2013)  Target Ranges:  Prepandial:   less than 140 mg/dL      Peak postprandial:   less than 180 mg/dL (1-2 hours)      Critically ill patients:  140 - 180 mg/dL     Results for [name] [name], [name] (MRN [id number]) as of 08/12/2013 09:12  Ref. Range 08/11/2013 13:00  Hemoglobin A1C Latest Range: <5.7 % 7.2 (H)    Per Ortho PA H&P note, patient has history of Pre-Diabetes.  Likely has DM diagnosis now given A1c is 7.2%.  I see that patient saw Dr. Everardo AllEllison [facility] on 04/20/13 earlier this year, however, there was no mention of DM in Dr. George HughEllison's note about this patient.  Patient in surgery today.  Will order Living Well with DM booklet and will see patient either today or tomorrow to discuss new diagnosis.   MD- Please make sure to order CBGs tid ac + HS post-op and may want to add Novolog Sensitive SSI for post-surgery while in hospital.  Based on A1c, patient likely needs Metformin as an outpatient and needs to follow up with his PCP.    Will follow Ambrose FinlandJeannine Johnston Merrill Villarruel RN, MSN, CDE Diabetes Coordinator Inpatient Diabetes Program Team Pager: [phone] (8a-10p)

## 2013-08-12 NOTE — Progress Notes (Signed)
Orthopedic Tech Progress Note Patient Details:  Marthenia RollingCharles Mahoney 18-Apr-1960 [id number] OHF applied to bed Patient ID: Marthenia Rollingharles Hulett, male   DOB: 18-Apr-1960, 53 y.o.   MRN: [id number]   Orie Routsia R Thompson 08/12/2013, 3:36 PM

## 2013-08-12 NOTE — Brief Op Note (Signed)
08/11/2013 - 08/12/2013  10:29 AM  PATIENT:  [name] [name]  53 y.o. male  PRE-OPERATIVE DIAGNOSIS:   1. RIGHT DISTAL FEMORAL CONDYLE FRACTURE  2. INTRAARTICULAR LOOSE BODY 10mm x 25mm X 7mm  POST-OPERATIVE DIAGNOSIS:   1. RIGHT DISTAL FEMORAL CONDYLE FRACTURE  2. INTRAARTICULAR LOOSE BODY 10mm x 20mm X 7mm 3. LATERAL MENISCUS TEAR   PROCEDURE:  Procedure(s): 1. OPEN REDUCTION INTERNAL FIXATION (ORIF) RIGHT DISTAL FEMUR FRACTURE (Right) 2. REMOVAL INTRAARTICULAR LOOSE BODY, LARGE 3. ARTHROTOMY WITH PARTIAL LATERAL MENISCECTOMY   SURGEON:  Surgeon(s) and Role:    * Budd PalmerMichael H Ezekeil Bethel, MD - Primary  PHYSICIAN ASSISTANT: Montez MoritaKeith Paul, PA-C  ANESTHESIA:   general  I/O:     SPECIMEN:  No Specimen  TOURNIQUET:  * No tourniquets in log *  DICTATION: .Other Dictation: Dictation Number [id number]

## 2013-08-12 NOTE — Anesthesia Postprocedure Evaluation (Signed)
  Anesthesia Post-op Note  Patient: [name] [name]  Procedure(s) Performed: Procedure(s): OPEN REDUCTION INTERNAL FIXATION (ORIF) RIGHT DISTAL FEMUR FRACTURE (Right)  Patient Location: PACU  Anesthesia Type:General  Level of Consciousness: awake, oriented, sedated and patient cooperative  Airway and Oxygen Therapy: Patient Spontanous Breathing  Post-op Pain: mild  Post-op Assessment: Post-op Vital signs reviewed, Patient's Cardiovascular Status Stable, Respiratory Function Stable, Patent Airway, No signs of Nausea or vomiting and Pain level controlled  Post-op Vital Signs: stable  Last Vitals:  Filed Vitals:   08/12/13 1125  BP:   Pulse: 94  Temp:   Resp: 20    Complications: No apparent anesthesia complications

## 2013-08-12 NOTE — Anesthesia Preprocedure Evaluation (Addendum)
Anesthesia Evaluation  Patient identified by MRN, date of birth, ID band Patient awake    Reviewed: Allergy & Precautions, H&P , NPO status , Patient's Chart, lab work & pertinent test results  Airway Mallampati: II      Dental  (+) Teeth Intact   Pulmonary Current Smoker,          Cardiovascular     Neuro/Psych    GI/Hepatic   Endo/Other    Renal/GU      Musculoskeletal   Abdominal   Peds  Hematology   Anesthesia Other Findings   Reproductive/Obstetrics                          Anesthesia Physical Anesthesia Plan  ASA: III  Anesthesia Plan: General   Post-op Pain Management:    Induction: Intravenous  Airway Management Planned: Oral ETT  Additional Equipment:   Intra-op Plan:   Post-operative Plan: Extubation in OR  Informed Consent: I have reviewed the patients History and Physical, chart, labs and discussed the procedure including the risks, benefits and alternatives for the proposed anesthesia with the patient or authorized representative who has indicated his/her understanding and acceptance.     Plan Discussed with:   Anesthesia Plan Comments:         Anesthesia Quick Evaluation

## 2013-08-12 NOTE — H&P (Signed)
Patient's pain was so severe and he had been on couch continuously since d/c from ED.  He was unable to come to office for follow up in a private vehicle because of pain and difficulty with mobility.  He was therefore admitted directly, where better pain control achieved, labs completed, and CT scan obtained overnight in preparation for surgery today.  I have seen and examined the patient. I agree with the findings above.  I discussed with the patient the risks and benefits of surgery for right distal femur repair, including the possibility of infection, nerve injury, vessel injury, wound breakdown, arthritis, symptomatic hardware, DVT/ PE, loss of motion, and need for further surgery among others.  We also specifically discussed possibility of arthrotomy to remove intraarticular debris or fragment and the potential to need early rather than late TKA.  He understood these risks and wished to proceed.   Budd PalmerHANDY,Jesselee Poth H, MD 08/12/2013 7:47 AM

## 2013-08-12 NOTE — Evaluation (Signed)
Physical Therapy Evaluation Patient Details Name: [name] [name] MRN: [id number] DOB: August 16, 1960 Today's Date: 08/12/2013   History of Present Illness  Admitted with R distal femur condyle fx, and Lateral menical tear.  S/p ORIF of femur and meniscectomy.  Clinical Impression  Pt admitted with/for ORIF of R distal femur fx.  Pt currently limited functionally due to the problems listed below.  (see problems list.)  Pt will benefit from PT to maximize function and safety to be able to get home safely with available assist of family.     Follow Up Recommendations Home health PT;Supervision for mobility/OOB    Equipment Recommendations  3in1 (PT)    Recommendations for Other Services       Precautions / Restrictions Precautions Precautions: Knee;Fall Restrictions Weight Bearing Restrictions: Yes RLE Weight Bearing: Non weight bearing Other Position/Activity Restrictions: No ROM restrictions      Mobility  Bed Mobility Overal bed mobility: Needs Assistance Bed Mobility: Supine to Sit     Supine to sit: Min guard     General bed mobility comments: heavy use of rail.  Transfers Overall transfer level: Needs assistance Equipment used: Rolling walker (2 wheeled) Transfers: Sit to/from [employer] Sit to Stand: Min assist Stand pivot transfers: Min assist       General transfer comment: cues and demo for safe technique and stability assist  Ambulation/Gait Ambulation/Gait assistance: Min assist Ambulation Distance (Feet): 7 Feet Assistive device: Rolling walker (2 wheeled) Gait Pattern/deviations:  ("swing to ")     General Gait Details: Guarded swing to gait pattern  Stairs            Wheelchair Mobility    Modified Rankin (Stroke Patients Only)       Balance Overall balance assessment: Needs assistance Sitting-balance support: No upper extremity supported Sitting balance-Leahy Scale: Good Sitting balance - Comments: mobilized and sat  in bed without incident                                     Pertinent Vitals/Pain Up t 10/10.   Pain meds given    Home Living Family/patient expects to be discharged to:: Private residence Living Arrangements: Spouse/significant other Available Help at Discharge: Family;Other (Comment) (daughter to help when wife at work) Type of Home: House Home Access: Stairs to enter Entrance Stairs-Rails: None Secretary/administrator of Steps: 3 Home Layout: One level Home Equipment: Environmental consultant - 2 wheels;Crutches      Prior Function Level of Independence: Independent               Hand Dominance        Extremity/Trunk Assessment   Upper Extremity Assessment: Overall WFL for tasks assessed           Lower Extremity Assessment: Overall WFL for tasks assessed;RLE deficits/detail RLE Deficits / Details: AAROM of knee in flexion to 70*    Cervical / Trunk Assessment: Normal  Communication   Communication: No difficulties  Cognition Arousal/Alertness: Awake/alert Behavior During Therapy: WFL for tasks assessed/performed Overall Cognitive Status: Within Functional Limits for tasks assessed                      General Comments      Exercises        Assessment/Plan    PT Assessment Patient needs continued PT services  PT Diagnosis Difficulty walking;Acute pain   PT Problem List Decreased activity  tolerance;Decreased mobility;Decreased knowledge of use of DME;Decreased knowledge of precautions;Decreased range of motion;Pain  PT Treatment Interventions DME instruction;Gait training;Stair training;Functional mobility training;Therapeutic activities;Therapeutic exercise;Patient/family education   PT Goals (Current goals can be found in the Care Plan section) Acute Rehab PT Goals Patient Stated Goal: As independent as possible at home PT Goal Formulation: With patient/family Time For Goal Achievement: 08/19/13 Potential to Achieve Goals: Good     Frequency Min 5X/week   Barriers to discharge        Co-evaluation               End of Session   Activity Tolerance: Patient tolerated treatment well Patient left: in chair;with call bell/phone within reach;with nursing/sitter in room Nurse Communication: Mobility status         Time: [id number] PT Time Calculation (min): 25 min   Charges:   PT Evaluation $Initial PT Evaluation Tier I: 1 Procedure PT Treatments $Therapeutic Activity: 8-22 mins   PT G Codes:          Joleena Weisenburger, Eliseo GumKenneth V 08/12/2013, 4:52 PM 08/12/2013  [place] BingKen Claudett Bayly, PT [phone]  (pager)

## 2013-08-12 NOTE — Anesthesia Procedure Notes (Signed)
Procedure Name: Intubation Date/Time: 08/12/2013 8:13 AM Performed by: Quentin OreWALKER, Cordera Stineman E Pre-anesthesia Checklist: Patient identified, Emergency Drugs available, Suction available, Patient being monitored and Timeout performed Patient Re-evaluated:Patient Re-evaluated prior to inductionOxygen Delivery Method: Circle system utilized Preoxygenation: Pre-oxygenation with 100% oxygen Intubation Type: IV induction Ventilation: Mask ventilation without difficulty and Oral airway inserted - appropriate to patient size Laryngoscope Size: Mac and 4 Grade View: Grade II Tube type: Oral Tube size: 7.5 mm Number of attempts: 1 Airway Equipment and Method: Stylet Placement Confirmation: ETT inserted through vocal cords under direct vision,  positive ETCO2 and breath sounds checked- equal and bilateral Secured at: 23 cm Tube secured with: Tape Dental Injury: Teeth and Oropharynx as per pre-operative assessment  Comments: DL x1 with MAC4, unable to visualize vocal cords per Kennith GainGraham Smith, paramedic student. Same DL MAC 4 repositioned per CRNA. AOI Grade II view per CRNA.

## 2013-08-12 NOTE — Transfer of Care (Signed)
Immediate Anesthesia Transfer of Care Note  Patient: [name] [name]  Procedure(s) Performed: Procedure(s): OPEN REDUCTION INTERNAL FIXATION (ORIF) RIGHT DISTAL FEMUR FRACTURE (Right)  Patient Location: PACU  Anesthesia Type:General  Level of Consciousness: sedated  Airway & Oxygen Therapy: Patient Spontanous Breathing and Patient connected to face mask oxygen  Post-op Assessment: Report given to PACU RN and Post -op Vital signs reviewed and stable  Post vital signs: Reviewed and stable  Complications: No apparent anesthesia complications

## 2013-08-13 ENCOUNTER — Encounter (HOSPITAL_COMMUNITY): Payer: Self-pay | Admitting: Orthopedic Surgery

## 2013-08-13 DIAGNOSIS — E8881 Metabolic syndrome: Secondary | ICD-10-CM | POA: Diagnosis present

## 2013-08-13 DIAGNOSIS — E119 Type 2 diabetes mellitus without complications: Secondary | ICD-10-CM | POA: Diagnosis present

## 2013-08-13 LAB — CBC
HEMATOCRIT: 39.3 % (ref 39.0–52.0)
Hemoglobin: 12.7 g/dL — ABNORMAL LOW (ref 13.0–17.0)
MCH: 32.4 pg (ref 26.0–34.0)
MCHC: 32.3 g/dL (ref 30.0–36.0)
MCV: 100.3 fL — ABNORMAL HIGH (ref 78.0–100.0)
Platelets: 249 10*3/uL (ref 150–400)
RBC: 3.92 MIL/uL — AB (ref 4.22–5.81)
RDW: 13 % (ref 11.5–15.5)
WBC: 9.1 10*3/uL (ref 4.0–10.5)

## 2013-08-13 LAB — LIPID PANEL
CHOL/HDL RATIO: 5.9 ratio
Cholesterol: 188 mg/dL (ref 0–200)
HDL: 32 mg/dL — AB (ref >39)
LDL Cholesterol: 116 mg/dL — ABNORMAL HIGH (ref 0–99)
Triglycerides: 200 mg/dL — ABNORMAL HIGH (ref <150)
VLDL: 40 mg/dL (ref 0–40)

## 2013-08-13 LAB — TESTOSTERONE, FREE: TESTOSTERONE FREE: 35.2 pg/mL — AB (ref 47.0–244.0)

## 2013-08-13 LAB — BASIC METABOLIC PANEL
Anion gap: 13 (ref 5–15)
BUN: 9 mg/dL (ref 6–23)
CALCIUM: 8.8 mg/dL (ref 8.4–10.5)
CHLORIDE: 97 meq/L (ref 96–112)
CO2: 28 meq/L (ref 19–32)
CREATININE: 0.72 mg/dL (ref 0.50–1.35)
GFR calc Af Amer: >90 mL/min (ref >90)
GFR calc non Af Amer: >90 mL/min (ref >90)
GLUCOSE: 154 mg/dL — AB (ref 70–99)
Potassium: 4.5 mEq/L (ref 3.7–5.3)
Sodium: 138 mEq/L (ref 137–147)

## 2013-08-13 LAB — GLUCOSE, CAPILLARY
Glucose-Capillary: 160 mg/dL — ABNORMAL HIGH (ref 70–99)
Glucose-Capillary: 165 mg/dL — ABNORMAL HIGH (ref 70–99)
Glucose-Capillary: 136 mg/dL — ABNORMAL HIGH (ref 70–99)
Glucose-Capillary: 159 mg/dL — ABNORMAL HIGH (ref 70–99)

## 2013-08-13 LAB — TESTOSTERONE, % FREE: TESTOSTERONE-% FREE: 2.6 % — AB (ref 1.6–2.9)

## 2013-08-13 LAB — PSA: PSA: 0.64 ng/mL (ref <=4.00)

## 2013-08-13 LAB — FREE PSA
PSA, Free Pct: 16 % — ABNORMAL LOW (ref >25)
PSA, Free: 0.1 ng/mL

## 2013-08-13 LAB — SEX HORMONE BINDING GLOBULIN: SEX HORMONE BINDING: 17 nmol/L (ref 13–71)

## 2013-08-13 MED ORDER — LISINOPRIL 10 MG PO TABS
10.0000 mg | ORAL_TABLET | Freq: Every day | ORAL | Status: DC
  Administered 2013-08-13: 10 mg via ORAL
  Filled 2013-08-13 (×2): qty 1

## 2013-08-13 MED ORDER — OXYCODONE HCL 5 MG PO TABS: 5.0000 mg | ORAL_TABLET | ORAL | Status: DC | PRN

## 2013-08-13 MED ORDER — ENOXAPARIN SODIUM 40 MG/0.4ML ~~LOC~~ SOLN: 40.0000 mg | SUBCUTANEOUS | Status: DC

## 2013-08-13 MED ORDER — METFORMIN HCL 500 MG PO TABS: 500.0000 mg | ORAL_TABLET | Freq: Every day | ORAL | Status: AC

## 2013-08-13 MED ORDER — ENOXAPARIN SODIUM 40 MG/0.4ML ~~LOC~~ SOLN
40.0000 mg | SUBCUTANEOUS | Status: DC
  Administered 2013-08-14: 40 mg via SUBCUTANEOUS
  Filled 2013-08-13: qty 0.4

## 2013-08-13 MED ORDER — ENOXAPARIN (LOVENOX) PATIENT EDUCATION KIT: PACK | Freq: Once | Status: DC

## 2013-08-13 MED ORDER — OXYCODONE-ACETAMINOPHEN 5-325 MG PO TABS: 1.0000 | ORAL_TABLET | Freq: Four times a day (QID) | ORAL | Status: DC | PRN

## 2013-08-13 MED ORDER — METFORMIN HCL 500 MG PO TABS
500.0000 mg | ORAL_TABLET | Freq: Every day | ORAL | Status: DC
  Administered 2013-08-13 – 2013-08-14 (×2): 500 mg via ORAL
  Filled 2013-08-13 (×3): qty 1

## 2013-08-13 MED ORDER — DSS 100 MG PO CAPS: 100.0000 mg | ORAL_CAPSULE | Freq: Two times a day (BID) | ORAL | Status: DC

## 2013-08-13 MED ORDER — ENOXAPARIN (LOVENOX) PATIENT EDUCATION KIT: 1.0000 | PACK | Freq: Once | Status: DC

## 2013-08-13 MED ORDER — LISINOPRIL 10 MG PO TABS: 10.0000 mg | ORAL_TABLET | Freq: Every day | ORAL | Status: AC

## 2013-08-13 MED ORDER — METHOCARBAMOL 500 MG PO TABS: 500.0000 mg | ORAL_TABLET | Freq: Four times a day (QID) | ORAL | Status: DC | PRN

## 2013-08-13 MED ORDER — ASPIRIN 81 MG PO CHEW
81.0000 mg | CHEWABLE_TABLET | Freq: Every day | ORAL | Status: DC
  Administered 2013-08-13: 81 mg via ORAL
  Filled 2013-08-13 (×2): qty 1

## 2013-08-13 MED ORDER — ATORVASTATIN CALCIUM 10 MG PO TABS
10.0000 mg | ORAL_TABLET | Freq: Every day | ORAL | Status: DC
  Administered 2013-08-13: 10 mg via ORAL
  Filled 2013-08-13 (×2): qty 1

## 2013-08-13 MED ORDER — ASPIRIN 81 MG PO CHEW: 81.0000 mg | CHEWABLE_TABLET | Freq: Every day | ORAL | Status: DC

## 2013-08-13 NOTE — Progress Notes (Signed)
Inpatient Diabetes Program Recommendations  AACE/ADA: New Consensus Statement on Inpatient Glycemic Control (2013)  Target Ranges:  Prepandial:   less than 140 mg/dL      Peak postprandial:   less than 180 mg/dL (1-2 hours)      Critically ill patients:  140 - 180 mg/dL     Results for [name] [name], [name] [name] (MRN [id number]) as of 08/13/2013 08:55  Ref. Range 08/12/2013 07:01 08/12/2013 17:24 08/12/2013 21:42  Glucose-Capillary Latest Range: 70-99 mg/dL 409149 (H) 811162 (H) 914161 (H)    Results for [name] [name], [name] [name] (MRN [id number]) as of 08/13/2013 08:55  Ref. Range 08/11/2013 13:00  Hemoglobin A1C Latest Range: <5.7 % 7.2 (H)    POD#1 ORIF.   Per Ortho PA H&P note, patient has history of Pre-Diabetes. Likely has DM diagnosis now given A1c is 7.2%. I see that patient saw Dr. Everardo AllEllison with [facility] on 04/20/13 earlier this year, however, there was no mention of DM in Dr. George HughEllison's note about this patient.   **Spoke with pt about new diagnosis.  Discussed A1C results with him and explained what an A1C is, basic pathophysiology of DM Type 2, basic home care, importance of checking CBGs and maintaining good CBG control to prevent long-term and short-term complications.  RNs to provide ongoing basic DM education at bedside with this patient.  Have ordered educational booklet, RD consult, and DM videos.  **Patient told me no one in his family has DM.  Patient told me Dr. Everlene OtherBouska (his PCP at [facility]) told him he has issues with high blood sugars but never told him he has DM.  Patient has follow-up appointment with Dr. Everlene OtherBouska on 08/19/13. Encouraged patient to discuss this new diagnosis with his PCP at that visit.  **Reviewed basic DM diet information with this patient.  Patient told me he drinks a lot of beer and fruit juice and does not monitor portion sizes.  Encouraged patient to limit his consumption of beer and to reduce his fruit juice intake to 1/2 cup of juice a day.  Patient  stated he loves cranberry juice and thought it was healthy.  Explained to patient that all fruit juices have lots of carbohydrates and that it is best to limit consumption of these drinks.  Gave patient basic carbohydrate counting educational guide and reviewed how to read a nutritional label with patient.  Explained to patient that if he makes nutritional changes at home in conjunction with exercise that he may likely not need medication.  Encouraged patient to review his A1c with his PCP and to discuss initiation of oral medications with Dr. Everlene OtherBouska at his appointment on 07/16.    Will follow Ambrose FinlandJeannine Johnston Brette Cast RN, MSN, CDE Diabetes Coordinator Inpatient Diabetes Program Team Pager: [phone] (8a-10p)

## 2013-08-13 NOTE — Op Note (Signed)
NAMMarthenia [name]:  [name], [name]               ACCOUNT NO.:  [id number]  MEDICAL RECORD NO.:  [id number]  LOCATION:                                 FACILITY:  PHYSICIAN:  [name] [name], M.D. DATE OF BIRTH:  [birth year]  DATE OF PROCEDURE:  08/12/2013 DATE OF DISCHARGE:                              OPERATIVE REPORT   PREOPERATIVE DIAGNOSES: 1. Right lateral femoral condyle fracture. 2. Intra-articular loose body, 25 mm x 10 mm x 7 mm.  POSTOPERATIVE DIAGNOSES: 1. Right lateral femoral condyle fracture. 2. Intra-articular loose body, 25 mm x 10 mm x 7 mm. 3. Lateral meniscus tear, mid body.  PROCEDURES: 1. Open reduction and internal fixation of right distal femur     fracture. 2. Removal of intra-articular loose body, large through an arthrotomy. 3. Arthrotomy with partial lateral meniscectomy.  SURGEON:  [name] [name], M.D.  ASSISTANT:  [name] LatinKeith W Paul, PA-C  ANESTHESIA:  General.  COMPLICATIONS:  None.  TOURNIQUET:  None.  DISPOSITION:  To PACU.  CONDITION:  Stable.  BRIEF SUMMARY AND INDICATION FOR PROCEDURE:  [name] [name] is a 53 year old multi-trauma patient who fell several days ago sustaining a distal femoral condyle fracture.  The patient was sent home from the ED, but has had significant difficulty managing his symptoms and was on the couch continuously until he presented to the office and was unable to take a private vehicle to the office as well.  Consequently, he was admitted directly to the hospital and surgery will be required.  He underwent CT scan and IV medications to facilitate pain control.  I discussed with him preoperatively risks and benefits of surgery including the possibility of failure to alleviate his symptoms, malunion, nonunion, loss of motion, DVT, PE, arthritis, heart attack, stroke, anesthetic complications, and many others.  The patient understood these risks as did his wife.  He did wish to proceed.  BRIEF SUMMARY OF PROCEDURE:  The  patient received preoperative antibiotics, taken to the operating room where general anesthesia was induced.  His right lower extremity was prepped and draped in usual sterile fashion.  Again, he did receive preoperative antibiotics.  A standard lateral approach was made to the distal femur.  This had to be extended distally and communicated with the old incision in order to facilitate adequate exposure and removal of the loose body, which ended up being submeniscal.  I went outside the retinaculum deep to the coronary ligament and elevated the meniscus from its insertion.  This was later repaired with vertical mattress 0-Prolene sutures.  The rather enormous loose body was identified and removed.  It did measure over 2 cm in length and was 1 cm in width.  I initially attempted to expose the fragment through an arthrotomy superior to the coronary ligament and meniscus.  We did not identify the loose body, but at that point, we did identify substantial tear at the mid body of the meniscus and extending into the posterior horn.  I used a 15-blade to sharply excise this torn area of meniscus.  We did preserve the rim in large part.  Once it was tacked back down, it appeared to present a much more morphologically  correct femur meniscus articulation.  The knee joint was also irrigated quite thoroughly.  I did initially evacuate the hematoma.  I did not identify any other loose bodies.  Attention was then turned to the fracture where my assistant pulled longitudinal traction, slight varus to reduce the femoral condyle.  This was secured with a large King-Tong clamp and a 3.5 Recon plate was fashioned along the lateral side placing a distal standard screw into the plate and 2 standard screws as again a buttress immediately above the fracture line, lastly 2 cannulated screws were used to assist with holding intra-articular reduction using a washer on the more anterior one given the poor bone  quality after his polytrauma.  Final images were taken in multiple views to ensure that the hardware was of the appropriate length and trajectory.  Reduction appeared to be anatomic. Wound was irrigated once again and closed in standard layered fashion with #1 Vicryl, 0-Vicryl, 2-0 nylon for the skin.  Sterile gently compressive dressing was applied.  The patient was awakened from anesthesia and transported to the PACU in stable condition.  [name] Morita, PA-C, assisted me throughout and was absolutely necessary for safe and effective completion in order to retract for exposure within the joint to assist with reduction of the femoral condyle fracture and he also assisted me with subcutaneous wound closure.  [name] [name] will be non-weightbearing with unrestricted motion.  We anticipate a 2-day stay in the hospital depending on his mobility.  He is at increased risk for progression of his arthritis given the post- traumatic changes present already as well as the development of this loose body and now the meniscal injury.     [name] [name]. Carola Frost, M.D.     MHH/MEDQ  D:  08/12/2013  T:  08/12/2013  Job:  [id number]

## 2013-08-13 NOTE — Discharge Summary (Signed)
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 Schedule an appointment as soon as possible for a visit in 2 weeks.  Specialty:  Endocrinology   Contact information:   [address] [place] [address] [phone]       Discharge Instructions and Plan:  The patient does have a relatively complex issue pertaining only to orthopedics but just a general medical conditions as well. From a technical standpoint his orthopedic injuries have been fixed and should heal without difficulty and a normal patient however the patient does have numerous abnormalities and may make healing difficult primarily his hypogonadism with low testosterone. Fortunately his vitamin D levels are now corrected but that his low testosterone was still intact his overall healing ability as evidenced by another low energy mechanism fracture. We stressed the importance of following up with  endocrinology as well as his PCP to address his diabetes which will also impact his ability to heal.   will check the patient back in to 3 weeks for reevaluation followup x-rays and removal of the sutures.  He will remain nonweightbearing for the next 6 weeks. Unrestricted range of motion of his right knee is permitted  Signed:  Jari Pigg, PA-C [facility] [phone] (P) 08/13/2013, 9:23 AM  **Disclaimer: This note may have been dictated with voice recognition software. Similar sounding words can inadvertently be transcribed and this note may contain transcription errors which may not have been corrected upon publication of note.**

## 2013-08-13 NOTE — Progress Notes (Signed)
Occupational Therapy Evaluation and Discharge Patient Details Name: [name] [name] MRN: [id number] DOB: Jul 07, 1960 Today's Date: 08/13/2013    History of Present Illness Admitted with R distal femur condyle fx, and Lateral menical tear.  S/p ORIF of femur and meniscectomy.   Clinical Impression   PTA pt was independent with ADLs and functional mobility. Pt moves somewhat impulsively and required close min guard for transfers and functional mobility into bathroom. Pt was irritable with OT and did not attend well to pt education. Attempted to educated pt on fall prevention, energy conservation, and compensatory techniques for LB ADLs. Pt's daughter and wife will be home to assist with ADLs. No further acute OT needs.     Follow Up Recommendations  No OT follow up;Supervision/Assistance - 24 hour    Equipment Recommendations  None recommended by OT (pt has 3N1 delivered already)       Precautions / Restrictions Precautions Precautions: Knee;Fall Required Braces or Orthoses: Other Brace/Splint;Knee Immobilizer - Right Other Brace/Splint: CAM boot Restrictions Weight Bearing Restrictions: Yes RLE Weight Bearing: Non weight bearing      Mobility Bed Mobility Overal bed mobility: Needs Assistance Bed Mobility: Supine to Sit;Sit to Supine     Supine to sit: Supervision;HOB elevated Sit to supine: Supervision;HOB elevated   General bed mobility comments: Pt able to manage RLE off/on bed  Transfers Overall transfer level: Needs assistance Equipment used: Rolling walker (2 wheeled) Transfers: Sit to/from Stand Sit to Stand: Min guard         General transfer comment: Very close min guard for sit<>stand when powering up due to NWB on RLE. Pt required VC's for safety and handplacement, however is somewhat impulsive and irritable and does not respond well to VC's.     Balance Overall balance assessment: Needs assistance Sitting-balance support: No upper extremity supported;Feet  supported Sitting balance-Leahy Scale: Good     Standing balance support: Bilateral upper extremity supported;During functional activity Standing balance-Leahy Scale: Poor Standing balance comment: Pt able to removed on hand from RW to open door to bathroom.                             ADL Overall ADL's : Needs assistance/impaired Eating/Feeding: Independent;Sitting   Grooming: Min guard;Standing;Cueing for safety   Upper Body Bathing: Set up;Sitting   Lower Body Bathing: Minimal assistance;Sit to/from stand   Upper Body Dressing : Set up;Sitting   Lower Body Dressing: Sit to/from stand;Moderate assistance (including KI and CAM Boot)   Toilet Transfer: Min guard;Ambulation;RW Toilet Transfer Details (indicate cue type and reason): close guard, pt moves impulsively and VC's for maintaining NWB Toileting- Clothing Manipulation and Hygiene: Min guard;Sit to/from stand Pt sat on BSC over toilet in bathroom, however was not far enough back and urinated on the floor. Pt became more irritated and cursed out and stated "you want me to slip on this floor." OT cleaned floor thoroughly and pt able to ambulate safely out of bathroom.    Tub/ Shower Transfer: Tub transfer;Minimal assistance;3 in 1;Rolling walker   Functional mobility during ADLs: Min guard;Rolling walker (close guard) General ADL Comments: Pt appears irritable and eager to return home. Reports that he was managing with ADLs prior to surgery with fx leg. Pt required close min guard for transfers. Educated pt on fall prevention and energy conservation.      Vision  Pt reports no change from baseline.  No apparent visual deficits.  Perception Perception Perception Tested?: No   Praxis Praxis Praxis tested?: Within functional limits    Pertinent Vitals/Pain Pt did no c/o pain. NAD     Hand Dominance Right   Extremity/Trunk Assessment Upper Extremity Assessment Upper Extremity  Assessment: Overall WFL for tasks assessed   Lower Extremity Assessment Lower Extremity Assessment: Defer to PT evaluation   Cervical / Trunk Assessment Cervical / Trunk Assessment: Normal   Communication Communication Communication: No difficulties   Cognition Arousal/Alertness: Awake/alert Behavior During Therapy: Impulsive (irritable) Overall Cognitive Status: Within Functional Limits for tasks assessed                                Home Living Family/patient expects to be discharged to:: Private residence Living Arrangements: Spouse/significant other Available Help at Discharge: Family;Other (Comment) (daughter to help when wife at work) Type of Home: House Home Access: Stairs to enter Secretary/administratorntrance Stairs-Number of Steps: 3 Entrance Stairs-Rails: None Home Layout: One level     Bathroom Shower/Tub: Chief Strategy OfficerTub/shower unit   Bathroom Toilet: Standard Bathroom Accessibility: Yes How Accessible: Accessible via wheelchair Home Equipment: Dan HumphreysWalker - 2 wheels;Crutches          Prior Functioning/Environment Level of Independence: Independent                                       End of Session Equipment Utilized During Treatment: Gait belt;Rolling walker;Right knee immobilizer;Other (comment) (CAM boot)  Activity Tolerance: Patient tolerated treatment well Patient left: in bed;with call bell/phone within reach   Time: 1610-96041048-1112 OT Time Calculation (min): 24 min Charges:  OT General Charges $OT Visit: 1 Procedure OT Evaluation $Initial OT Evaluation Tier I: 1 Procedure OT Treatments $Self Care/Home Management : 23-37 mins  Rae LipsMiller, Natividad Halls M [phone] 08/13/2013, 11:26 AM

## 2013-08-13 NOTE — Care Management Note (Signed)
  Page 1 of 1   08/13/2013     9:56:13 AM CARE MANAGEMENT NOTE 08/13/2013  Patient:  [name],Jarion   Account Number:  [id number]  Date Initiated:  08/13/2013  Documentation initiated by:  Ronny FlurryWILE,Maximilliano Kersh  Subjective/Objective Assessment:     Action/Plan:   Anticipated DC Date:  08/14/2013   Anticipated DC Plan:  HOME W HOME HEALTH SERVICES         Choice offered to / List presented to:  C-1 Patient   DME arranged  3-N-1      DME agency  [facility]     HH arranged  HH[facility]2 [facility]      [facility]   Status of service:  Completed, signed off Medicare Important Message given?   (If response is "NO", the following Medicare IM given date fields will be blank) Date Medicare IM given:   Medicare IM given by:   Date Additional Medicare IM given:   Additional Medicare IM given by:    Discharge Disposition:    Per UR Regulation:    If discussed at Long Length of Stay Meetings, dates discussed:    Comments:  08-13-13 Confirmed face sheet information.  Patient states he has prescription coverage and can aford medications. Ronny FlurryHeather Nilan Iddings RN BSN [phone]

## 2013-08-13 NOTE — Discharge Instructions (Signed)
[facility] Discharge Instructions  General Discharge Instructions  WEIGHT BEARING STATUS: Nonweightbearing R leg  RANGE OF MOTION/ACTIVITY: Range of motion as tolerated R knee. No pillows under knee at rest   Medications: the lisinopril and metformin are part of the [facility] $4 generic program. You should be able to get these filled $4 each per month  Daily dressing changes starting on 08/15/2013. See instructions below   Diet: as you were eating previously.  Can use over the counter stool softeners and bowel preparations, such as Miralax, to help with bowel movements.  Narcotics can be constipating.  Be sure to drink plenty of fluids  STOP SMOKING OR USING NICOTINE PRODUCTS!!!!  As discussed nicotine severely impairs your body's ability to heal surgical and traumatic wounds but also impairs bone healing.  Wounds and bone heal by forming microscopic blood vessels (angiogenesis) and nicotine is a vasoconstrictor (essentially, shrinks blood vessels).  Therefore, if vasoconstriction occurs to these microscopic blood vessels they essentially disappear and are unable to deliver necessary nutrients to the healing tissue.  This is one modifiable factor that you can do to dramatically increase your chances of healing your injury.    (This means no smoking, no nicotine gum, patches, etc)  DO NOT USE NONSTEROIDAL ANTI-INFLAMMATORY DRUGS (NSAID'S)  Using products such as Advil (ibuprofen), Aleve (naproxen), Motrin (ibuprofen) for additional pain control during fracture healing can delay and/or prevent the healing response.  If you would like to take over the counter (OTC) medication, Tylenol (acetaminophen) is ok.  However, some narcotic medications that are given for pain control contain acetaminophen as well. Therefore, you should not exceed more than 4000 mg of tylenol in a day if you do not have liver disease.  Also note that there are may OTC medicines, such as cold medicines and allergy  medicines that my contain tylenol as well.  If you have any questions about medications and/or interactions please ask your doctor/PA or your pharmacist.   PAIN MEDICATION USE AND EXPECTATIONS  You have likely been given narcotic medications to help control your pain.  After a traumatic event that results in an fracture (broken bone) with or without surgery, it is ok to use narcotic pain medications to help control one's pain.  We understand that everyone responds to pain differently and each individual patient will be evaluated on a regular basis for the continued need for narcotic medications. Ideally, narcotic medication use should last no more than 6-8 weeks (coinciding with fracture healing).   As a patient it is your responsibility as well to monitor narcotic medication use and report the amount and frequency you use these medications when you come to your office visit.   We would also advise that if you are using narcotic medications, you should take a dose prior to therapy to maximize you participation.  IF YOU ARE ON NARCOTIC MEDICATIONS IT IS NOT PERMISSIBLE TO OPERATE A MOTOR VEHICLE (MOTORCYCLE/CAR/TRUCK/MOPED) OR HEAVY MACHINERY DO NOT MIX NARCOTICS WITH OTHER CNS (CENTRAL NERVOUS SYSTEM) DEPRESSANTS SUCH AS ALCOHOL       ICE AND ELEVATE INJURED/OPERATIVE EXTREMITY  Using ice and elevating the injured extremity above your heart can help with swelling and pain control.  Icing in a pulsatile fashion, such as 20 minutes on and 20 minutes off, can be followed.    Do not place ice directly on skin. Make sure there is a barrier between to skin and the ice pack.    Using frozen items such as frozen peas  works well as the conform nicely to the are that needs to be iced.  USE AN ACE WRAP OR TED HOSE FOR SWELLING CONTROL  In addition to icing and elevation, Ace wraps or TED hose are used to help limit and resolve swelling.  It is recommended to use Ace wraps or TED hose until you are informed  to stop.    When using Ace Wraps start the wrapping distally (farthest away from the body) and wrap proximally (closer to the body)   Example: If you had surgery on your leg or thing and you do not have a splint on, start the ace wrap at the toes and work your way up to the thigh        If you had surgery on your upper extremity and do not have a splint on, start the ace wrap at your fingers and work your way up to the upper arm  IF YOU ARE IN A SPLINT OR CAST DO NOT REMOVE IT FOR ANY REASON   If your splint gets wet for any reason please contact the office immediately. You may shower in your splint or cast as long as you keep it dry.  This can be done by wrapping in a cast cover or garbage back (or similar)  Do Not stick any thing down your splint or cast such as pencils, money, or hangers to try and scratch yourself with.  If you feel itchy take benadryl as prescribed on the bottle for itching  IF YOU ARE IN A CAM BOOT (BLACK BOOT)  You may remove boot periodically. Perform daily dressing changes as noted below.  Wash the liner of the boot regularly and wear a sock when wearing the boot. It is recommended that you sleep in the boot until told otherwise  CALL THE OFFICE WITH ANY QUESTIONS OR CONCERTS: [phone]     Discharge Pin Site Instructions  Dress pins daily with Kerlix roll starting on POD 2. Wrap the Kerlix so that it tamps the skin down around the pin-skin interface to prevent/limit motion of the skin relative to the pin.  (Pin-skin motion is the primary cause of pain and infection related to external fixator pin sites).  Remove any crust or coagulum that may obstruct drainage with a saline moistened gauze or soap and water.  After POD 3, if there is no discernable drainage on the pin site dressing, the interval for change can by increased to every other day.  You may shower with the fixator, cleaning all pin sites gently with soap and water.  If you have a surgical wound this  needs to be completely dry and without drainage before showering.  The extremity can be lifted by the fixator to facilitate wound care and transfers.  Notify the office/Doctor if you experience increasing drainage, redness, or pain from a pin site, or if you notice purulent (thick, snot-like) drainage.  Discharge Wound Care Instructions  Do NOT apply any ointments, solutions or lotions to pin sites or surgical wounds.  These prevent needed drainage and even though solutions like hydrogen peroxide kill bacteria, they also damage cells lining the pin sites that help fight infection.  Applying lotions or ointments can keep the wounds moist and can cause them to breakdown and open up as well. This can increase the risk for infection. When in doubt call the office.  Surgical incisions should be dressed daily.  If any drainage is noted, use one layer of adaptic, then gauze, Kerlix, and  an ace wrap.  Once the incision is completely dry and without drainage, it may be left open to air out.  Showering may begin 36-48 hours later.  Cleaning gently with soap and water.  Traumatic wounds should be dressed daily as well.    One layer of adaptic, gauze, Kerlix, then ace wrap.  The adaptic can be discontinued once the draining has ceased    If you have a wet to dry dressing: wet the gauze with saline the squeeze as much saline out so the gauze is moist (not soaking wet), place moistened gauze over wound, then place a dry gauze over the moist one, followed by Kerlix wrap, then ace wrap.

## 2013-08-13 NOTE — Plan of Care (Signed)
Problem: Food- and Nutrition-Related Knowledge Deficit (NB-1.1) Goal: Nutrition education Formal process to instruct or train a patient/client in a skill or to impart knowledge to help patients/clients voluntarily manage or modify food choices and eating behavior to maintain or improve health. Outcome: Adequate for Discharge RD consulted for nutrition education regarding diabetes.     Lab Results  Component Value Date    HGBA1C 7.2* 08/11/2013   DM Coordinator has already visited pt. He reports that he is hopeful that he can control his diabetes through diet and exercise. He and his wife share meal preparation responsibilities. He reports he intends to start exercising once he recovers.   RD provided "Carbohydrate Counting for People with Diabetes" handout from the Academy of [employer]etics. Discussed different food groups and their effects on blood sugar, emphasizing carbohydrate-containing foods. Provided list of carbohydrates and recommended serving sizes of common foods.  Discussed importance of controlled and consistent carbohydrate intake throughout the day. Provided examples of ways to balance meals/snacks and encouraged intake of high-fiber, whole grain complex carbohydrates. Teach back method used.  Expect fair compliance.  Body mass index is 37.64 kg/(m^2). Pt meets criteria for obesity, class II based on current BMI.  Current diet order is Carb Modified, patient is consuming approximately 95-100% of meals at this time. Labs and medications reviewed. No further nutrition interventions warranted at this time. RD contact information provided. If additional nutrition issues arise, please re-consult RD.  Leonard Feigel A. Mayford KnifeWilliams, RD, LDN Pager: [phone] After hours Pager: [phone]

## 2013-08-13 NOTE — Progress Notes (Signed)
Physical Therapy Treatment Patient Details Name: [name] [name] MRN: [id number] DOB: 20-May-1960 Today's Date: 08/13/2013    History of Present Illness Admitted with R distal femur condyle fx, and Lateral menical tear.  S/p ORIF of femur and meniscectomy.    PT Comments    Patient making good gains with mobility and gait.  Needs to try stairs prior to d/c in am.  Follow Up Recommendations  Home health PT;Supervision for mobility/OOB     Equipment Recommendations  3in1 (PT)    Recommendations for Other Services       Precautions / Restrictions Precautions Precautions: Knee;Fall Required Braces or Orthoses: Other Brace/Splint (No orders) Other Brace/Splint: Has CAM boot Restrictions Weight Bearing Restrictions: Yes RLE Weight Bearing: Non weight bearing Other Position/Activity Restrictions: No ROM restrictions    Mobility  Bed Mobility Overal bed mobility: Modified Independent Bed Mobility: Supine to Sit;Sit to Supine     Supine to sit: Modified independent (Device/Increase time) Sit to supine: Modified independent (Device/Increase time)   General bed mobility comments: With bed rail.  Assist to donn CAM boot  Transfers Overall transfer level: Needs assistance Equipment used: Rolling walker (2 wheeled) Transfers: Sit to/from Stand Sit to Stand: Min guard         General transfer comment: Verbal cues for hand placement and safety.  Needed repeated cues for safety with stand > sit (sitting prior to safely reaching bed).  Ambulation/Gait Ambulation/Gait assistance: Min guard Ambulation Distance (Feet): 82 Feet Assistive device: Rolling walker (2 wheeled) Gait Pattern/deviations: Step-to pattern Gait velocity: Decreased Gait velocity interpretation: Below normal speed for age/gender General Gait Details: Verbal cues for safe use of RW and to maintain NWB RLE.   Stairs            Wheelchair Mobility    Modified Rankin (Stroke Patients Only)        Balance Overall balance assessment: Needs assistance Sitting-balance support: No upper extremity supported;Feet supported Sitting balance-Leahy Scale: Good     Standing balance support: Bilateral upper extremity supported;During functional activity Standing balance-Leahy Scale: Poor Standing balance comment: Pt able to removed on hand from RW to open door to bathroom.                     Cognition Arousal/Alertness: Awake/alert Behavior During Therapy: Impulsive (Didn't listen to PT instructions) Overall Cognitive Status: Within Functional Limits for tasks assessed (Decreased safety awareness)                      Exercises      General Comments        Pertinent Vitals/Pain Pain 6/10 with mobility.    Home Living Family/patient expects to be discharged to:: Private residence Living Arrangements: Spouse/significant other Available Help at Discharge: Family;Other (Comment) (daughter to help when wife at work) Type of Home: House Home Access: Stairs to enter Entrance Stairs-Rails: None Home Layout: One level Home Equipment: Environmental consultant - 2 wheels;Crutches      Prior Function Level of Independence: Independent          PT Goals (current goals can now be found in the care plan section) Progress towards PT goals: Progressing toward goals    Frequency  Min 5X/week    PT Plan Current plan remains appropriate    Co-evaluation             End of Session Equipment Utilized During Treatment: Gait belt Activity Tolerance: Patient tolerated treatment well Patient left: in bed;with call  bell/phone within reach     Time: 1350-1407 PT Time Calculation (min): 17 min  Charges:  $Gait Training: 8-22 mins                    G Codes:      Vena AustriaDavis, [name] Dembeck H 08/13/2013, 2:27 PM Durenda HurtSusan H. Renaldo Fiddleravis, PT, [facility] Pager [phone]

## 2013-08-13 NOTE — Progress Notes (Signed)
[facility] Progress Note  Subjective  Doing ok this am Little sleep last night- watched horror movie marathon   Aware of Diabetes diagnosis Saw Dr. Everlene Other about 2 weeks ago who informed him Has not filled meds yet as he has been unable to afford, specifically the chantix and lipitor, and he has been dealing with other medical issues, brown [name] spider bite to L shoulder and R distal femur fracture  Tolerating diet Voiding w/o difficulty     Objective   BP 121/81  Pulse 86  Temp(Src) 98 F (36.7 C) (Oral)  Resp 20  Ht 5\' 9"  (1.753 m)  Wt 115.667 kg (255 lb)  BMI 37.64 kg/m2  SpO2 96%  Estimated body mass index is 37.64 kg/(m^2) as calculated from the following:   Height as of this encounter: 5\' 9"  (1.753 m).   Weight as of this encounter: 115.667 kg (255 lb).   Intake/Output     07/09 0701 - 07/10 0700 07/10 0701 - 07/11 0700   P.O. 2060    I.V. (mL/kg) 1670 (14.4)    Total Intake(mL/kg) 3730 (32.2)    Urine (mL/kg/hr) 2650 (1)    Blood 100 (0)    Total Output 2750     Net +980            Labs  Results for [name] [name] (MRN [id number]) as of 08/13/2013 08:32  Ref. Range 08/13/2013 01:33  Sodium Latest Range: 137-147 mEq/L 138  Potassium Latest Range: 3.7-5.3 mEq/L 4.5  Chloride Latest Range: 96-112 mEq/L 97  CO2 Latest Range: 19-32 mEq/L 28  BUN Latest Range: 6-23 mg/dL 9  Creatinine Latest Range: 0.50-1.35 mg/dL 4.09  Calcium Latest Range: 8.4-10.5 mg/dL 8.8  GFR calc non Af Amer Latest Range: >90 mL/min >90  GFR calc Af Amer Latest Range: >90 mL/min >90  Glucose Latest Range: 70-99 mg/dL 811 (H)  Anion gap Latest Range: 5-15  13  Cholesterol Latest Range: 0-200 mg/dL 914  Triglycerides Latest Range: <150 mg/dL 782 (H)  HDL Latest Range: >39 mg/dL 32 (L)  LDL (calc) Latest Range: 0-99 mg/dL 956 (H)  VLDL Latest Range: 0-40 mg/dL 40  Total CHOL/HDL Ratio No range found 5.9   Results for [name] [name] (MRN [id number]) as of  08/13/2013 08:32  Ref. Range 08/13/2013 01:33  WBC Latest Range: 4.0-10.5 K/uL 9.1  RBC Latest Range: 4.22-5.81 MIL/uL 3.92 (L)  Hemoglobin Latest Range: 13.0-17.0 g/dL 46.9 (L)  HCT Latest Range: 39.0-52.0 % 39.3  MCV Latest Range: 78.0-100.0 fL 100.3 (H)  MCH Latest Range: 26.0-34.0 pg 32.4  MCHC Latest Range: 30.0-36.0 g/dL 62.9  RDW Latest Range: 11.5-15.5 % 13.0  Platelets Latest Range: 150-400 K/uL 249    Results for [name] [name] (MRN [id number]) as of 08/13/2013 08:32  Ref. Range 08/11/2013 13:00  Vit D, 25-Hydroxy Latest Range: 30-89 ng/mL 44   Results for [name] [name] (MRN [id number]) as of 08/13/2013 08:32  Ref. Range 08/11/2013 13:00  PTH Latest Range: 14.0-72.0 pg/mL 32.7    Results for [name] [name] (MRN [id number]) as of 08/13/2013 08:32  Ref. Range 08/12/2013 02:55  LH Latest Range: 1.5-9.3 mIU/mL 1.7  FSH Latest Range: 1.4-18.1 mIU/mL 4.2  Prolactin Latest Range: 2.1-17.1 ng/mL 12.0  Testosterone Latest Range: 300-890 ng/dL 595 (L)  Results for [name] [name] (MRN [id number]) as of 08/13/2013 08:32  Ref. Range 08/12/2013 02:55  TSH Latest Range: 0.350-4.500 uIU/mL 4.270   Results for [name] [name] (MRN [id number]) as of 08/13/2013 08:32  Ref. Range 08/11/2013 13:00  Hemoglobin A1C Latest Range: <5.7 % 7.2 (H)  Glucose Latest Range: 70-99 mg/dL 914165 (H)  Results for [name] [name] (MRN [id number]) as of 08/13/2013 08:32  Ref. Range 08/12/2013 02:55  PSA Latest Range: <=4.00 ng/mL 0.64  PSA, Free No range found 0.10  PSA, Free Pct Latest Range: >25 % 16 (L)     Exam  Gen: resting in bed, wearing just a sheet Lungs: CTA B  Cardiac: s1 and s2, RRR Abd: obese, NT, + BS Ext:       Right Lower Extremity   Dressing c/d/i  Moved pillow from under knee to under ankle  DPN, SPN, TN sensation intact  Ext warm  + DP pulse  No dct   Compartments, soft and NT  EHL, FHL, AT, PT, peroneals, gastroc motor intact      Assessment and Plan   POD/HD#: 1   53 y/o white male  s/p ground level fall with R lateral femoral condyle fracture   1. Ground level fall with R lateral femoral condyle fracture  NWB x 6 weeks  ROM as tolerated R knee, no restrictions  PT/OT evals   Pt needs to work on stairs, has 3 to get into house   Ice and elevate  Dressing change tomorrow   2. Pain management:  Percocet, oxy IR and robaxin  3. ABL anemia/Hemodynamics  Stable  4. Medical issues   Type 2 DM   Pt is supposed to be on metformin 500 mg po daily per PCP note, pt has not filled meds yet due to recent medical events   Hyperlipidemia/elevated triglycerides   meds have been prescribed for pt by PCP, pt has not filled as he states the cost is prohibitive    Low testosterone/hypogondadism    Established this dx back in march 2014   Pt finally saw endocrine in march 2015 but did not get ordered labs, check these during this hospitalization which shows persistent low testosterone, normal LH and FSH, normal prolactin   F/u with endocrine for further recs    Likely impacting his bone density as it is quite poor   Consider forteo therapy as it has been shown to expedite healing process in acute fractures and is indicated in males with androgen deficiency     Vitamin D deficiency    Vastly improved from march 2014, increased from 13 to 44 ng/mL   Nicotine dependence   Considering quitting. Chantix was prescribed but pt can not afford.  Could possible consider wellbutin SR    HTN   Started on lisinopril by PCP. Has not filled Rx    Obesity    Likely contributing to low T and other medical issues   Metabolic syndrome- pt with abdominal obesity, DM, elevated triglycerides, low HDLs, HTN.  He is at increased risk for CAD.  Pt was started on ASA as well as metformin, lipitor, lisinopril to address. Pt indicated he has not filled meds yet    [name] [name] spider bite- stable, wound healing nicely    5. DVT/PE prophylaxis:  Lovenox x 14 days post op   6. ID:   Completed  periop abx   7. Metabolic Bone Disease:  Pt with poor bone quality  DM, low T, nicotine abuse, obesity likely contributing to poor bone density  Consider forteo therapy as there is indication for its use in males with androgen deficiency   Will need to see if we can get medication assistance   8. Activity:  As tolerated  while maintaining weightbearing restrictions   9. FEN/Foley/Lines:  CHO mod diet  NSL IV    10. Impediments to fracture healing:  See #7  11. Dispo:  Therapies  Dc home tomorrow  Follow up with PCP in 2 weeks  Follow up with Endo in 2 weeks  Follow up with ortho in 2 weeks    Checked the [facility] $4 generic program- lisinopril and metformin are both on the list. Lovastatin is the only statin on the list. ASA is OTC. So only medication that may cost a lot is chantix.  So in theory pt get get his Rx meds for $12 a month + cost of OTC ASA     Mearl Latin, PA-C [facility] [phone] (P) 08/13/2013 8:30 AM  **Disclaimer: This note may have been dictated with voice recognition software. Similar sounding words can inadvertently be transcribed and this note may contain transcription errors which may not have been corrected upon publication of note.**

## 2013-08-14 LAB — VITAMIN D 1,25 DIHYDROXY
Vitamin D 1, 25 (OH)2 Total: 55 pg/mL (ref 18–72)
Vitamin D2 1, 25 (OH)2: <8 pg/mL
Vitamin D3 1, 25 (OH)2: 55 pg/mL

## 2013-08-14 LAB — GLUCOSE, CAPILLARY: GLUCOSE-CAPILLARY: 146 mg/dL — AB (ref 70–99)

## 2013-08-14 NOTE — Progress Notes (Signed)
Instructed on how to give Lovenox Injections in stomach- wife demonstrated technique using balloon. Teachback good. Taught her how to clean skin, inject, withdraw needle and sheath needle. Instructed on dressing changes daily and given dressing materials to take home. Sutures remain intact to right, lateral knee and puncture hole to right medial knee. No drainage present, but lateral incision pink. Placed 4x4s over each site, covered with ABD pad, wrapped with Kerlix (instructed to wrap from bottom to top), then wrapped with ACE wrap (again instructed from foot to thigh). Black boot replaced. Crutches and bedside commode ordered and given to patient. Discharge instructions and prescriptions given with directions not to take any more pain medication until 12Noon. Wife and patient verbalized understanding of above instructions. Trina Aoarla Areliz Rothman, RN

## 2013-08-14 NOTE — Progress Notes (Signed)
Physical Therapy Treatment Patient Details Name: [name] [name] MRN: [id number] DOB: October 09, 1960 Today's Date: 08/14/2013    History of Present Illness      PT Comments    Pt d/c'd home today.  Pt to continue progress toward goals with HHPT.  Follow Up Recommendations  Home health PT;Supervision for mobility/OOB     Equipment Recommendations  3in1 (PT)    Recommendations for Other Services       Precautions / Restrictions Precautions Precautions: Knee;Fall Required Braces or Orthoses: Other Brace/Splint Other Brace/Splint: CAM boot RLE Restrictions RLE Weight Bearing: Non weight bearing    Mobility  Bed Mobility Overal bed mobility: Modified Independent                Transfers   Equipment used: [name]   Sit to Stand: Min guard Stand pivot transfers: Min guard       General transfer comment: verbal cues for safety  Ambulation/Gait Ambulation/Gait assistance: Min guard Ambulation Distance (Feet): 30 Feet Assistive device: [name] Gait Pattern/deviations: Step-to pattern;Antalgic Gait velocity: decreased   General Gait Details: verbal cues for safety and to maintain NWB RLE   Stairs Stairs: Yes Stairs assistance: Mod assist Stair Management: With walker;Backwards Number of Stairs: 2 General stair comments: Pt feels ascending stairs with RW is too difficult and prefers sitting down and bumping up on his bottom.  He reports having to use this method last year when he fractured BLE.  Method review with pt and he verbalizes good understanding.  Wheelchair Mobility    Modified Rankin (Stroke Patients Only)       Balance                                    Cognition Arousal/Alertness: Awake/alert Behavior During Therapy: Impulsive Overall Cognitive Status: Within Functional Limits for tasks assessed                      Exercises      General Comments        Pertinent  Vitals/Pain 8/10    Home Living                      Prior Function            PT Goals (current goals can now be found in the care plan section) Progress towards PT goals: Progressing toward goals    Frequency  Min 5X/week    PT Plan Current plan remains appropriate    Co-evaluation             End of Session Equipment Utilized During Treatment: Gait belt;Other (comment) (CAM boot) Activity Tolerance: Patient tolerated treatment well Patient left: in chair;with call bell/phone within reach     Time: [id number] PT Time Calculation (min): 24 min  Charges:  $Gait Training: 23-37 mins                    G Codes:      Ilda FoilGarrow, Lizette Pazos Rene 08/14/2013, 11:08 AM  Aida RaiderWendy Sheryn Aldaz, PT  Office # [phone] Pager [phone]

## 2013-08-14 NOTE — Discharge Summary (Signed)
Patient ID: [name] [name] MRN: [id number] DOB/AGE: March 10, 1960 53 y.o.  Admit date: 08/11/2013 Discharge date: 08/14/2013  Admission Diagnoses:  Principal Problem:   Fracture, femur, distal Active Problems:   Obesity (BMI 30-39.9)   Nicotine dependence   Secondary male hypogonadism   Blood glucose abnormal   Diabetes   Metabolic syndrome   Discharge Diagnoses:  Same  Past Medical History  Diagnosis Date  . Unspecified vitamin D deficiency 04/03/2012  . 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 Obesity, unspecified 04/03/2012  . Nicotine dependence 04/03/2012  . Diabetes   . Metabolic syndrome     Surgeries: Procedure(s): OPEN REDUCTION INTERNAL FIXATION (ORIF) RIGHT DISTAL FEMUR FRACTURE on 08/11/2013 - 08/12/2013   Consultants:  Trauma  Discharged Condition: Improved  Hospital Course: [name] [name] is an 53 y.o. male who was admitted 08/11/2013 for operative treatment ofFracture, femur, distal. Patient has severe unremitting pain that affects sleep, daily activities, and work/hobbies. After pre-op clearance the patient was taken to the operating room on 08/11/2013 - 08/12/2013 and underwent  Procedure(s): OPEN REDUCTION INTERNAL FIXATION (ORIF) RIGHT DISTAL FEMUR FRACTURE.    Patient was given perioperative antibiotics: Anti-infectives   Start     Dose/Rate Route Frequency Ordered Stop   08/12/13 1430  ceFAZolin (ANCEF) IVPB 1 g/50 mL premix     1 g 100 mL/hr over 30 Minutes Intravenous Every 6 hours 08/12/13 1330 08/13/13 0242   08/12/13 0600  ceFAZolin (ANCEF) IVPB 2 g/50 mL premix     2 g 100 mL/hr over 30 Minutes Intravenous On call to O.R. 08/11/13 1025 08/12/13 0835       Patient was given sequential compression devices, early ambulation, and chemoprophylaxis to prevent DVT.  Patient benefited maximally from hospital stay and there were no complications.    Recent vital signs: Patient Vitals for the past 24 hrs:  BP Temp Temp src Pulse Resp SpO2  08/14/13 0610 156/78  mmHg 98.3 F (36.8 C) Oral 84 15 93 %  08/14/13 0141 149/93 mmHg 98.1 F (36.7 C) Oral 79 15 92 %  08/13/13 2226 139/80 mmHg 98.1 F (36.7 C) Oral 80 15 93 %  08/13/13 1349 138/82 mmHg 98.6 F (37 C) Oral 86 18 96 %  08/13/13 0956 148/84 mmHg 98.4 F (36.9 C) Oral 87 18 92 %     Recent laboratory studies:  Recent Labs  08/11/13 1300 08/12/13 1409 08/13/13 0133  WBC 8.8 10.6* 9.1  HGB 14.3 14.1 12.7*  HCT 42.1 43.7 39.3  PLT 266 263 249  NA 139  --  138  K 4.0  --  4.5  CL 97  --  97  CO2 27  --  28  BUN 9  --  9  CREATININE 0.60 0.64 0.72  GLUCOSE 165*  --  154*  INR 0.99  --   --   CALCIUM 9.3  9.1  --  8.8     Discharge Medications:     Medication List    STOP taking these medications       HYDROcodone-acetaminophen 5-325 MG per tablet  Commonly known as:  NORCO/VICODIN      TAKE these medications       aspirin 81 MG chewable tablet  Chew 1 tablet (81 mg total) by mouth daily.     DSS 100 MG Caps  Take 100 mg by mouth 2 (two) times daily.     enoxaparin 40 MG/0.4ML injection  Commonly known as:  LOVENOX  Inject 0.4 mLs (40 mg  total) into the skin daily.     enoxaparin Kit  Commonly known as:  LOVENOX  1 kit by Does not apply route once.     erythromycin 250 MG tablet  Commonly known as:  E-MYCIN  Take 2 tablets (500 mg total) by mouth 2 (two) times daily.     lisinopril 10 MG tablet  Commonly known as:  PRINIVIL,ZESTRIL  Take 1 tablet (10 mg total) by mouth daily.     metFORMIN 500 MG tablet  Commonly known as:  GLUCOPHAGE  Take 1 tablet (500 mg total) by mouth daily with breakfast.     methocarbamol 500 MG tablet  Commonly known as:  ROBAXIN  Take 1-2 tablets (500-1,000 mg total) by mouth every 6 (six) hours as needed for muscle spasms.     oxyCODONE 5 MG immediate release tablet  Commonly known as:  Oxy IR/ROXICODONE  Take 1-2 tablets (5-10 mg total) by mouth every 3 (three) hours as needed for severe pain or breakthrough pain (take  between percocet).     oxyCODONE-acetaminophen 5-325 MG per tablet  Commonly known as:  PERCOCET/ROXICET  Take 1-2 tablets by mouth every 6 (six) hours as needed for moderate pain or severe pain.        Diagnostic Studies: Ct Knee Right Wo Contrast  08/11/2013   CLINICAL DATA:  Pain and swelling of the knee. Distal femur fracture. Tibial plateau fracture.  EXAM: CT OF THE RIGHT KNEE WITHOUT CONTRAST  TECHNIQUE: Multidetector CT imaging of the RIGHT knee was performed according to the standard protocol. Multiplanar CT image reconstructions were also generated.  COMPARISON:  04/02/2012.  FINDINGS: Diffuse osteopenia is present. Hemarthrosis of the knee is present associated with an acute obliquely oriented medial distal femur fracture. This extends from the anterior medial metaphysis through the medial femoral condyle and into the medial margin of the intercondylar notch. Fracture is minimally displaced. Maximal displacement is 2 mm. Fracture extends into the weight-bearing surface of the medial femoral condyle (image 37 series 2010).  The lateral femoral condyle appears intact. Multiple loose bodies are present in the joint with the largest in the anterior medial compartment measuring 2 cm transverse, 1 cm AP and 7 mm craniocaudal. Calcification is present posterior to the medial compartment, probably extra-articular an representing heterotopic bone. Tibial plateau fracture with medial buttress plate and lateral buttress plate and screw fixation. No recurrent tibial fracture. The tibial fracture is healed. There is persisting cortical irregularity of the medial femoral condyle weight-bearing surface and to a lesser extent the lateral femoral condyle. Central osteophytes are present in the medial PA and lateral weight-bearing femoral condyles. Moderate patellofemoral osteoarthritis. Diffuse edema is present around the knee. Scarring associated with buttress plate and screw fixation.  IMPRESSION: 1. Acute  oblique nondisplaced fracture of the distal femur with intra-articular extension into the medial femoral condyle weight-bearing surface. 2. Healed comminuted tibial plateau fracture with medial and lateral buttress plate and screw fixation.   Electronically Signed   By: Dereck Ligas M.D.   On: 08/11/2013 13:19   Portable Chest 1 View  08/11/2013   CLINICAL DATA:  Right knee fracture.  Preoperative respiratory exam.  EXAM: PORTABLE CHEST - 1 VIEW  COMPARISON:  04/01/2012  FINDINGS: Stable mild cardiomegaly. Stable mild elevation of the right hemidiaphragm. There is no evidence of pulmonary edema, consolidation, pneumothorax, nodule or pleural fluid. No bony abnormalities are seen.  IMPRESSION: Stable mild cardiomegaly.  No acute findings.   Electronically Signed   By: Eulas Post  Kathlene Cote M.D.   On: 08/11/2013 11:15   Dg Knee Complete 4 Views Right  08/12/2013   CLINICAL DATA:  orif right femur  EXAM: RIGHT KNEE - COMPLETE 4+ VIEW  COMPARISON:  Yesterday  FINDINGS: Images depict placement of plates and screws within the distal femur and proximal tibia for internal fixation. Anatomic alignment is preserved.  IMPRESSION: ORIF fractures of the femur and tibia.   Electronically Signed   By: Maryclare Bean M.D.   On: 08/12/2013 12:08   Dg Knee Complete 4 Views Right  08/07/2013   CLINICAL DATA:  Right knee pain after fall  EXAM: RIGHT KNEE - COMPLETE 4+ VIEW  COMPARISON:  None.  FINDINGS: There is a healed comminuted tibial plateau fracture transfixed by medial and lateral metallic sideplate and multiple interlocking screws. There there is persistent irregularity of the lateral tibial plateau articular surface.  There is a minimally displaced fracture of the lateral femoral condyle with the fracture cleft extending to the intercondylar notch with a lipohemarthrosis.  The soft tissues are unremarkable.  IMPRESSION: Minimally displaced fracture of the lateral femoral condyle with the fracture cleft extending to the  intercondylar notch with a lipohemarthrosis.   Electronically Signed   By: Kathreen Devoid   On: 08/07/2013 15:14   Dg Knee Right Port  08/12/2013   CLINICAL DATA:  Open reduction internal fixation distal femur fracture  EXAM: PORTABLE RIGHT KNEE - 1-2 VIEW  COMPARISON:  CT right knee region August 11, 2013  FINDINGS: Frontal and lateral views were obtained. There is screw and plate fixation through the distal femoral metaphysis and epiphysis regions with alignment in these areas appearing anatomic. There is evidence of old trauma involving the proximal tibia with extensive postoperative change and remodeling. There is no new fracture. No dislocation or appreciable effusion. There are multiple bony fragments within the right knee joint, a stable finding.  IMPRESSION: Alignment in the distal femoral region is anatomic postoperatively. Extensive postoperative and posttraumatic change in the proximal tibia with multiple bony fragments within the knee joint, stable. No dislocation. No appreciable joint effusion.   Electronically Signed   By: Lowella Grip M.D.   On: 08/12/2013 12:04   Dg C-arm 1-60 Min  08/12/2013   CLINICAL DATA:  Fracture  EXAM: DG C-ARM 61-120 MIN  COMPARISON:  None.  FINDINGS: Fluoroscopy was utilized by this Psychologist, sport and exercise.  IMPRESSION: See above.   Electronically Signed   By: Maryclare Bean M.D.   On: 08/12/2013 12:08    Disposition: 01-Home or Self Care      Discharge Instructions   Ambulatory referral to Nutrition and Diabetic Education    Complete by:  As directed   New diagnosis of DM2.  PCP is Dr. Coletta Memos at [facility].  Was supposed to start Metformin at home but has not started yet.  S/P ORIF with ortho surgery this admission.     Call MD / Call 911    Complete by:  As directed   If you experience chest pain or shortness of breath, CALL 911 and be transported to the hospital emergency room.  If you develope a fever above 101 F, pus (white drainage) or increased  drainage or redness at the wound, or calf pain, call your surgeon's office.     Constipation Prevention    Complete by:  As directed   Drink plenty of fluids.  Prune juice may be helpful.  You may use a stool softener, such as Colace (over  the counter) 100 mg twice a day.  Use MiraLax (over the counter) for constipation as needed.     Diet Carb Modified    Complete by:  As directed      Discharge instructions    Complete by:  As directed   Orthopaedic Trauma Service Discharge Instructions  General Discharge Instructions  WEIGHT BEARING STATUS: Nonweightbearing R leg  RANGE OF MOTION/ACTIVITY: Range of motion as tolerated R knee. No pillows under knee at rest   Medications: the lisinopril and metformin are part of the [facility] $4 generic program. You should be able to get these filled $4 each per month  Daily dressing changes starting on 08/15/2013. See instructions below   Diet: as you were eating previously.  Can use over the counter stool softeners and bowel preparations, such as Miralax, to help with bowel movements.  Narcotics can be constipating.  Be sure to drink plenty of fluids  STOP SMOKING OR USING NICOTINE PRODUCTS!!!!  As discussed nicotine severely impairs your body's ability to heal surgical and traumatic wounds but also impairs bone healing.  Wounds and bone heal by forming microscopic blood vessels (angiogenesis) and nicotine is a vasoconstrictor (essentially, shrinks blood vessels).  Therefore, if vasoconstriction occurs to these microscopic blood vessels they essentially disappear and are unable to deliver necessary nutrients to the healing tissue.  This is one modifiable factor that you can do to dramatically increase your chances of healing your injury.    (This means no smoking, no nicotine gum, patches, etc)  DO NOT USE NONSTEROIDAL ANTI-INFLAMMATORY DRUGS (NSAID'S)  Using products such as Advil (ibuprofen), Aleve (naproxen), Motrin (ibuprofen) for additional pain  control during fracture healing can delay and/or prevent the healing response.  If you would like to take over the counter (OTC) medication, Tylenol (acetaminophen) is ok.  However, some narcotic medications that are given for pain control contain acetaminophen as well. Therefore, you should not exceed more than 4000 mg of tylenol in a day if you do not have liver disease.  Also note that there are may OTC medicines, such as cold medicines and allergy medicines that my contain tylenol as well.  If you have any questions about medications and/or interactions please ask your doctor/PA or your pharmacist.   PAIN MEDICATION USE AND EXPECTATIONS  You have likely been given narcotic medications to help control your pain.  After a traumatic event that results in an fracture (broken bone) with or without surgery, it is ok to use narcotic pain medications to help control one's pain.  We understand that everyone responds to pain differently and each individual patient will be evaluated on a regular basis for the continued need for narcotic medications. Ideally, narcotic medication use should last no more than 6-8 weeks (coinciding with fracture healing).   As a patient it is your responsibility as well to monitor narcotic medication use and report the amount and frequency you use these medications when you come to your office visit.   We would also advise that if you are using narcotic medications, you should take a dose prior to therapy to maximize you participation.  IF YOU ARE ON NARCOTIC MEDICATIONS IT IS NOT PERMISSIBLE TO OPERATE A MOTOR VEHICLE (MOTORCYCLE/CAR/TRUCK/MOPED) OR HEAVY MACHINERY DO NOT MIX NARCOTICS WITH OTHER CNS (CENTRAL NERVOUS SYSTEM) DEPRESSANTS SUCH AS ALCOHOL       ICE AND ELEVATE INJURED/OPERATIVE EXTREMITY  Using ice and elevating the injured extremity above your heart can help with swelling and pain control.  Icing  in a pulsatile fashion, such as 20 minutes on and 20 minutes off,  can be followed.    Do not place ice directly on skin. Make sure there is a barrier between to skin and the ice pack.    Using frozen items such as frozen peas works well as the conform nicely to the are that needs to be iced.  USE AN ACE WRAP OR TED HOSE FOR SWELLING CONTROL  In addition to icing and elevation, Ace wraps or TED hose are used to help limit and resolve swelling.  It is recommended to use Ace wraps or TED hose until you are informed to stop.    When using Ace Wraps start the wrapping distally (farthest away from the body) and wrap proximally (closer to the body)   Example: If you had surgery on your leg or thing and you do not have a splint on, start the ace wrap at the toes and work your way up to the thigh        If you had surgery on your upper extremity and do not have a splint on, start the ace wrap at your fingers and work your way up to the upper arm  IF YOU ARE IN A SPLINT OR CAST DO NOT [place]   If your splint gets wet for any reason please contact the office immediately. You may shower in your splint or cast as long as you keep it dry.  This can be done by wrapping in a cast cover or garbage back (or similar)  Do Not stick any thing down your splint or cast such as pencils, money, or hangers to try and scratch yourself with.  If you feel itchy take benadryl as prescribed on the bottle for itching  IF YOU ARE IN A CAM BOOT (BLACK BOOT)  You may remove boot periodically. Perform daily dressing changes as noted below.  Wash the liner of the boot regularly and wear a sock when wearing the boot. It is recommended that you sleep in the boot until told otherwise  CALL THE OFFICE WITH ANY QUESTIONS OR CONCERTS: [phone]     Discharge Pin Site Instructions  Dress pins daily with Kerlix roll starting on POD 2. Wrap the Kerlix so that it tamps the skin down around the pin-skin interface to prevent/limit motion of the skin relative to the pin.  (Pin-skin  motion is the primary cause of pain and infection related to external fixator pin sites).  Remove any crust or coagulum that may obstruct drainage with a saline moistened gauze or soap and water.  After POD 3, if there is no discernable drainage on the pin site dressing, the interval for change can by increased to every other day.  You may shower with the fixator, cleaning all pin sites gently with soap and water.  If you have a surgical wound this needs to be completely dry and without drainage before showering.  The extremity can be lifted by the fixator to facilitate wound care and transfers.  Notify the office/Doctor if you experience increasing drainage, redness, or pain from a pin site, or if you notice purulent (thick, snot-like) drainage.  Discharge Wound Care Instructions  Do NOT apply any ointments, solutions or lotions to pin sites or surgical wounds.  These prevent needed drainage and even though solutions like hydrogen peroxide kill bacteria, they also damage cells lining the pin sites that help fight infection.  Applying lotions or ointments can keep  the wounds moist and can cause them to breakdown and open up as well. This can increase the risk for infection. When in doubt call the office.  Surgical incisions should be dressed daily.  If any drainage is noted, use one layer of adaptic, then gauze, Kerlix, and an ace wrap.  Once the incision is completely dry and without drainage, it may be left open to air out.  Showering may begin 36-48 hours later.  Cleaning gently with soap and water.  Traumatic wounds should be dressed daily as well.    One layer of adaptic, gauze, Kerlix, then ace wrap.  The adaptic can be discontinued once the draining has ceased    If you have a wet to dry dressing: wet the gauze with saline the squeeze as much saline out so the gauze is moist (not soaking wet), place moistened gauze over wound, then place a dry gauze over the moist one, followed by  Kerlix wrap, then ace wrap.     Driving restrictions    Complete by:  As directed   No driving     Increase activity slowly as tolerated    Complete by:  As directed      Non weight bearing    Complete by:  As directed   Laterality:  right  Extremity:  Lower           Follow-up Information   Follow up with HANDY,MICHAEL H, MD. Schedule an appointment as soon as possible for a visit in 2 weeks. (For suture removal, For wound re-check)    Specialty:  Orthopedic Surgery   Contact information:   [address] [place] [address] [phone]       Follow up with BOUSKA,DAVID E, MD In 10 days.   Specialty:  Family Medicine   Contact information:   [address] [place] [address] [phone]       Follow up with Renato Shin, MD. Schedule an appointment as soon as possible for a visit in 2 weeks.   Specialty:  Endocrinology   Contact information:   [address] [place] [address] [phone]        Signed: Linda Hedges 08/14/2013, 8:22 AM

## 2013-08-17 ENCOUNTER — Encounter (HOSPITAL_COMMUNITY): Payer: Self-pay | Admitting: Orthopedic Surgery

## 2013-09-01 ENCOUNTER — Encounter (HOSPITAL_COMMUNITY): Payer: Self-pay | Admitting: Orthopedic Surgery

## 2014-02-09 ENCOUNTER — Encounter (HOSPITAL_COMMUNITY): Payer: Self-pay | Admitting: *Deleted

## 2014-02-09 MED ORDER — CEFAZOLIN SODIUM-DEXTROSE 2-3 GM-% IV SOLR
2.0000 g | INTRAVENOUS | Status: AC
  Administered 2014-02-10: 2 g via INTRAVENOUS
  Filled 2014-02-09: qty 50

## 2014-02-09 NOTE — Progress Notes (Signed)
   02/09/14 1050  OBSTRUCTIVE SLEEP APNEA  Have you ever been diagnosed with sleep apnea through a sleep study? No  Do you snore loudly (loud enough to be heard through closed doors)?  0  Do you often feel tired, fatigued, or sleepy during the daytime? 1  Has anyone observed you stop breathing during your sleep? 0  Do you have, or are you being treated for high blood pressure? 1  BMI more than 35 kg/m2? 1  Age over 56 years old? 1  Gender: 1  Obstructive Sleep Apnea Score 5

## 2014-02-09 NOTE — Progress Notes (Signed)
Pt denies SOB, chest pain, and being under the care of a cardiologist. Pt denies having a stress test, echo, and cardiac cath. Pt made aware to stop taking Aspirin, vitaminst, and herbal medications. Do not take any NSAIDs ie: Ibuprofen, Advil, Naproxen or any medication containing Aspirin and not to take any diabetic medication Glucophage   ( Metformin) the morning of procedure.

## 2014-02-09 NOTE — Progress Notes (Signed)
Error

## 2014-02-09 NOTE — H&P (Signed)
Orthopaedic Trauma Service H&P  Chief Complaint:  Symptomatic Hardware L calcaneus HPI:   54 y/o white male well known to [facility] from accitdent sustaine 03/2012, injuries included L calcaneus fracture that was addressed surgically.  Pt has been having unrelieved heel pain. [facility]   Past Medical History  Diagnosis Date  . Unspecified vitamin D deficiency 04/03/2012  . 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 Obesity, unspecified 04/03/2012  . Nicotine dependence 04/03/2012  . Diabetes   . Metabolic syndrome   . Hypercholesterolemia     Past Surgical History  Procedure Laterality Date  . Ankle fracture surgery  2010  . External fixation leg  04/01/2012    ex fix R tibial plateau fracture  . Orif tibia plateau Right 04/01/2012    Procedure: TIBIAL PLATEAU/External Fixation;  Surgeon: Mable ParisJustin William Chandler, MD;  [facility];  Service: Orthopedics;  Laterality: Right;  . Orif femur fracture Left 04/06/2012    Procedure: OPEN REDUCTION INTERNAL FIXATION (ORIF) DISTAL FEMUR FRACTURE;  Surgeon: Budd PalmerMichael H Handy, MD;  Location: [facility] OR;  Service: Orthopedics;  Laterality: Left;  Biomet Proximal Tibia Plate, 7.3 Cannulated screws, Synthes small fragment set.  . Orif tibia plateau Right 04/14/2012    Procedure: OPEN REDUCTION INTERNAL FIXATION (ORIF) TIBIAL PLATEAU;  Surgeon: Budd PalmerMichael H Handy, MD;  Location: [facility] OR;  Service: Orthopedics;  Laterality: Right;  . Orif femur fracture Right 08/12/2013    Procedure: OPEN REDUCTION INTERNAL FIXATION (ORIF) RIGHT DISTAL FEMUR FRACTURE;  Surgeon: Budd PalmerMichael H Handy, MD;  Location: [facility] OR;  Service: Orthopedics;  Laterality: Right;  . Appendectomy    . Colonoscopy w/ biopsies and polypectomy      Family History  Problem Relation Age of Onset  . Other Mother   . Cancer - Colon Father    Social History:  reports that he has been smoking Cigarettes.  He has a 45 pack-year smoking history. He has never used smokeless tobacco. He reports  that he drinks about 3.6 - 4.8 oz of alcohol per week. He reports that he uses illicit drugs (Marijuana).  Allergies: No Known Allergies  No prescriptions prior to admission    No results found for this or any previous visit (from the past 48 hour(s)). No results found.  Review of Systems  Constitutional: Negative for fever and chills.  Eyes: Negative for blurred vision.  Respiratory: Negative for shortness of breath and wheezing.   Cardiovascular: Negative for chest pain and palpitations.  Gastrointestinal: Negative for nausea, vomiting and abdominal pain.  Neurological: Negative for tingling, sensory change and headaches.    New vitals to be obtained on arrival to short stay   Physical Exam  Constitutional: He appears well-developed and well-nourished. He is cooperative. No distress.  Cardiovascular: Normal rate, regular rhythm, S1 normal and S2 normal.   Respiratory: Breath sounds normal.  GI: Soft. Normal appearance. There is no tenderness.  Musculoskeletal:  Left LEx   Surgical wounds healed    Distal motor and sensory functions intact   HW is prominent   + DP pulse   Ext warm    Neurological: He is alert.  Skin: Skin is warm and intact.  Psychiatric: He has a normal mood and affect. His speech is normal and behavior is normal. Cognition and memory are normal.     Assessment/Plan  54 y/o white male with symptomatic HW L calcaneus   [facility]  outpt procedure WBAT post op  Post op follow up in  10-14 days   Mearl Latin, PA-C [facility] [phone] (P) 02/09/2014, 2:55 PM

## 2014-02-09 NOTE — Progress Notes (Signed)
Anesthesia Chart Review:  Pt is 54 year old male scheduled for hardware removal of calcaneous on 02/10/2014 with Dr. Carola FrostHandy.  Pt is same day work up.   History includes: DM, hypercholesterolemia, metabolic syndrome.  BMI 37.64. Current smoker. S/p ORIF R distal femur fracture 08/12/2013.   Medications include: ASA, lisinopril, metformin, pravastatin.   EKG 08/07/2013: SR. Ventricular trigeminy. Probably atrial enlargement. RBBB. RBBB is shown on prior EKGs dating back to 2013. Trigeminy is new. Will repeat EKG DOS.   Rica Mastngela Shirl [place], FNP-BC [facility] Phone: [phone] 02/09/2014 3:16 PM

## 2014-02-10 ENCOUNTER — Ambulatory Visit (HOSPITAL_COMMUNITY): Payer: Worker's Compensation

## 2014-02-10 ENCOUNTER — Ambulatory Visit (HOSPITAL_COMMUNITY)
Admission: RE | Admit: 2014-02-10 | Discharge: 2014-02-10 | Disposition: A | Discharge status: Home / Self Care | Payer: Worker's Compensation | Source: Ambulatory Visit | Attending: Orthopedic Surgery | Admitting: Orthopedic Surgery

## 2014-02-10 ENCOUNTER — Ambulatory Visit (HOSPITAL_COMMUNITY): Payer: Worker's Compensation | Admitting: Vascular Surgery

## 2014-02-10 ENCOUNTER — Encounter (HOSPITAL_COMMUNITY)
Admission: RE | Disposition: A | Discharge status: Home / Self Care | Payer: Self-pay | Source: Ambulatory Visit | Attending: Orthopedic Surgery

## 2014-02-10 DIAGNOSIS — Y929 Unspecified place or not applicable: Secondary | ICD-10-CM | POA: Insufficient documentation

## 2014-02-10 DIAGNOSIS — T8484XA Pain due to internal orthopedic prosthetic devices, implants and grafts, initial encounter: Secondary | ICD-10-CM | POA: Diagnosis not present

## 2014-02-10 DIAGNOSIS — Z9889 Other specified postprocedural states: Secondary | ICD-10-CM | POA: Diagnosis not present

## 2014-02-10 DIAGNOSIS — E780 Pure hypercholesterolemia: Secondary | ICD-10-CM | POA: Insufficient documentation

## 2014-02-10 DIAGNOSIS — Y69 Unspecified misadventure during surgical and medical care: Secondary | ICD-10-CM | POA: Diagnosis not present

## 2014-02-10 DIAGNOSIS — Z6836 Body mass index (BMI) 36.0-36.9, adult: Secondary | ICD-10-CM | POA: Insufficient documentation

## 2014-02-10 DIAGNOSIS — E559 Vitamin D deficiency, unspecified: Secondary | ICD-10-CM | POA: Diagnosis not present

## 2014-02-10 DIAGNOSIS — F17210 Nicotine dependence, cigarettes, uncomplicated: Secondary | ICD-10-CM | POA: Diagnosis not present

## 2014-02-10 DIAGNOSIS — I10 Essential (primary) hypertension: Secondary | ICD-10-CM | POA: Diagnosis not present

## 2014-02-10 DIAGNOSIS — E119 Type 2 diabetes mellitus without complications: Secondary | ICD-10-CM | POA: Insufficient documentation

## 2014-02-10 DIAGNOSIS — Z419 Encounter for procedure for purposes other than remedying health state, unspecified: Secondary | ICD-10-CM

## 2014-02-10 DIAGNOSIS — F1210 Cannabis abuse, uncomplicated: Secondary | ICD-10-CM | POA: Insufficient documentation

## 2014-02-10 HISTORY — PX: HARDWARE REMOVAL: SHX979

## 2014-02-10 HISTORY — DX: Pure hypercholesterolemia, unspecified: E78.00

## 2014-02-10 LAB — BASIC METABOLIC PANEL
ANION GAP: 9 (ref 5–15)
BUN: 13 mg/dL (ref 6–23)
CALCIUM: 9.2 mg/dL (ref 8.4–10.5)
CHLORIDE: 103 meq/L (ref 96–112)
CO2: 25 mmol/L (ref 19–32)
Creatinine, Ser: 0.78 mg/dL (ref 0.50–1.35)
GFR calc Af Amer: >90 mL/min (ref >90)
GFR calc non Af Amer: >90 mL/min (ref >90)
GLUCOSE: 126 mg/dL — AB (ref 70–99)
Potassium: 4.4 mmol/L (ref 3.5–5.1)
SODIUM: 137 mmol/L (ref 135–145)

## 2014-02-10 LAB — CBC
HEMATOCRIT: 41.4 % (ref 39.0–52.0)
HEMOGLOBIN: 13.7 g/dL (ref 13.0–17.0)
MCH: 31.4 pg (ref 26.0–34.0)
MCHC: 33.1 g/dL (ref 30.0–36.0)
MCV: 95 fL (ref 78.0–100.0)
Platelets: 239 10*3/uL (ref 150–400)
RBC: 4.36 MIL/uL (ref 4.22–5.81)
RDW: 14.3 % (ref 11.5–15.5)
WBC: 7.5 10*3/uL (ref 4.0–10.5)

## 2014-02-10 LAB — GLUCOSE, CAPILLARY
GLUCOSE-CAPILLARY: 126 mg/dL — AB (ref 70–99)
GLUCOSE-CAPILLARY: 124 mg/dL — AB (ref 70–99)

## 2014-02-10 SURGERY — REMOVAL, HARDWARE
Anesthesia: General | Laterality: Left

## 2014-02-10 MED ORDER — SUCCINYLCHOLINE CHLORIDE 20 MG/ML IJ SOLN
INTRAMUSCULAR | Status: AC
  Filled 2014-02-10: qty 1

## 2014-02-10 MED ORDER — LIDOCAINE HCL (CARDIAC) 20 MG/ML IV SOLN
INTRAVENOUS | Status: DC | PRN
  Administered 2014-02-10: 80 mg via INTRAVENOUS

## 2014-02-10 MED ORDER — HYDROMORPHONE HCL 1 MG/ML IJ SOLN
0.2500 mg | INTRAMUSCULAR | Status: DC | PRN
  Administered 2014-02-10 (×2): 0.5 mg via INTRAVENOUS

## 2014-02-10 MED ORDER — PROMETHAZINE HCL 25 MG/ML IJ SOLN: 6.2500 mg | INTRAMUSCULAR | Status: DC | PRN

## 2014-02-10 MED ORDER — 0.9 % SODIUM CHLORIDE (POUR BTL) OPTIME
TOPICAL | Status: DC | PRN
  Administered 2014-02-10: 1000 mL

## 2014-02-10 MED ORDER — FENTANYL CITRATE 0.05 MG/ML IJ SOLN
INTRAMUSCULAR | Status: AC
  Filled 2014-02-10: qty 5

## 2014-02-10 MED ORDER — MIDAZOLAM HCL 2 MG/2ML IJ SOLN
INTRAMUSCULAR | Status: AC
  Filled 2014-02-10: qty 2

## 2014-02-10 MED ORDER — LACTATED RINGERS IV SOLN
INTRAVENOUS | Status: DC
Start: 2014-02-10 — End: 2014-02-10

## 2014-02-10 MED ORDER — PROPOFOL 10 MG/ML IV BOLUS
INTRAVENOUS | Status: DC | PRN
  Administered 2014-02-10: 200 mg via INTRAVENOUS
  Administered 2014-02-10: 70 mg via INTRAVENOUS

## 2014-02-10 MED ORDER — PROPOFOL 10 MG/ML IV BOLUS
INTRAVENOUS | Status: AC
  Filled 2014-02-10: qty 20

## 2014-02-10 MED ORDER — ROCURONIUM BROMIDE 50 MG/5ML IV SOLN
INTRAVENOUS | Status: AC
  Filled 2014-02-10: qty 1

## 2014-02-10 MED ORDER — PHENYLEPHRINE 40 MCG/ML (10ML) SYRINGE FOR IV PUSH (FOR BLOOD PRESSURE SUPPORT)
PREFILLED_SYRINGE | INTRAVENOUS | Status: AC
  Filled 2014-02-10: qty 10

## 2014-02-10 MED ORDER — BUPIVACAINE-EPINEPHRINE (PF) 0.5% -1:200000 IJ SOLN
INTRAMUSCULAR | Status: AC
  Filled 2014-02-10: qty 30

## 2014-02-10 MED ORDER — HYDROMORPHONE HCL 1 MG/ML IJ SOLN
INTRAMUSCULAR | Status: AC
  Filled 2014-02-10: qty 1

## 2014-02-10 MED ORDER — SODIUM CHLORIDE 0.9 % IJ SOLN
INTRAMUSCULAR | Status: AC
  Filled 2014-02-10: qty 10

## 2014-02-10 MED ORDER — EPHEDRINE SULFATE 50 MG/ML IJ SOLN
INTRAMUSCULAR | Status: DC | PRN
  Administered 2014-02-10: 15 mg via INTRAVENOUS
  Administered 2014-02-10: 10 mg via INTRAVENOUS

## 2014-02-10 MED ORDER — CHLORHEXIDINE GLUCONATE 4 % EX LIQD
60.0000 mL | Freq: Once | CUTANEOUS | Status: DC
  Filled 2014-02-10: qty 60

## 2014-02-10 MED ORDER — MIDAZOLAM HCL 5 MG/5ML IJ SOLN
INTRAMUSCULAR | Status: DC | PRN
  Administered 2014-02-10: 2 mg via INTRAVENOUS

## 2014-02-10 MED ORDER — ONDANSETRON HCL 4 MG/2ML IJ SOLN
INTRAMUSCULAR | Status: AC
  Filled 2014-02-10: qty 2

## 2014-02-10 MED ORDER — SUCCINYLCHOLINE CHLORIDE 20 MG/ML IJ SOLN
INTRAMUSCULAR | Status: DC | PRN
  Administered 2014-02-10: 100 mg via INTRAVENOUS

## 2014-02-10 MED ORDER — ONDANSETRON HCL 4 MG/2ML IJ SOLN
INTRAMUSCULAR | Status: DC | PRN
  Administered 2014-02-10: 4 mg via INTRAVENOUS

## 2014-02-10 MED ORDER — ONDANSETRON HCL 4 MG PO TABS: 4.0000 mg | ORAL_TABLET | Freq: Three times a day (TID) | ORAL | Status: DC | PRN

## 2014-02-10 MED ORDER — LIDOCAINE HCL (CARDIAC) 20 MG/ML IV SOLN
INTRAVENOUS | Status: AC
  Filled 2014-02-10: qty 5

## 2014-02-10 MED ORDER — FENTANYL CITRATE 0.05 MG/ML IJ SOLN
INTRAMUSCULAR | Status: DC | PRN
  Administered 2014-02-10: 100 ug via INTRAVENOUS
  Administered 2014-02-10: 50 ug via INTRAVENOUS

## 2014-02-10 MED ORDER — OXYCODONE HCL 5 MG PO TABS
ORAL_TABLET | ORAL | Status: AC
  Filled 2014-02-10: qty 1

## 2014-02-10 MED ORDER — TRAMADOL HCL 50 MG PO TABS: 50.0000 mg | ORAL_TABLET | Freq: Four times a day (QID) | ORAL | Status: DC | PRN

## 2014-02-10 MED ORDER — LACTATED RINGERS IV SOLN
INTRAVENOUS | Status: DC | PRN
  Administered 2014-02-10 (×2): via INTRAVENOUS

## 2014-02-10 MED ORDER — BUPIVACAINE-EPINEPHRINE 0.5% -1:200000 IJ SOLN
INTRAMUSCULAR | Status: DC | PRN
  Administered 2014-02-10: 10 mL

## 2014-02-10 MED ORDER — OXYCODONE HCL 5 MG PO TABS
5.0000 mg | ORAL_TABLET | Freq: Once | ORAL | Status: AC | PRN
  Administered 2014-02-10: 5 mg via ORAL

## 2014-02-10 MED ORDER — OXYCODONE HCL 5 MG/5ML PO SOLN: 5.0000 mg | Freq: Once | ORAL | Status: AC | PRN

## 2014-02-10 MED ORDER — EPHEDRINE SULFATE 50 MG/ML IJ SOLN
INTRAMUSCULAR | Status: AC
  Filled 2014-02-10: qty 1

## 2014-02-10 MED ORDER — OXYCODONE-ACETAMINOPHEN 5-325 MG PO TABS: 1.0000 | ORAL_TABLET | Freq: Three times a day (TID) | ORAL | Status: DC | PRN

## 2014-02-10 SURGICAL SUPPLY — 63 items
BANDAGE ELASTIC 4 VELCRO ST LF (GAUZE/BANDAGES/DRESSINGS) ×3 IMPLANT
BANDAGE ELASTIC 6 VELCRO ST LF (GAUZE/BANDAGES/DRESSINGS) ×3 IMPLANT
BANDAGE ESMARK 6X9 LF (GAUZE/BANDAGES/DRESSINGS) ×1 IMPLANT
BNDG CMPR 9X6 STRL LF SNTH (GAUZE/BANDAGES/DRESSINGS) ×1
BNDG COHESIVE 6X5 TAN STRL LF (GAUZE/BANDAGES/DRESSINGS) ×3 IMPLANT
BNDG ESMARK 6X9 LF (GAUZE/BANDAGES/DRESSINGS) ×3
BNDG GAUZE ELAST 4 BULKY (GAUZE/BANDAGES/DRESSINGS) ×4 IMPLANT
BRUSH SCRUB DISP (MISCELLANEOUS) ×6 IMPLANT
CLEANER TIP ELECTROSURG 2X2 (MISCELLANEOUS) ×3 IMPLANT
CLOSURE WOUND 1/2 X4 (GAUZE/BANDAGES/DRESSINGS)
COVER SURGICAL LIGHT HANDLE (MISCELLANEOUS) ×6 IMPLANT
CUFF TOURNIQUET SINGLE 18IN (TOURNIQUET CUFF) IMPLANT
CUFF TOURNIQUET SINGLE 24IN (TOURNIQUET CUFF) IMPLANT
CUFF TOURNIQUET SINGLE 34IN LL (TOURNIQUET CUFF) IMPLANT
DRAPE C-ARM 42X72 X-RAY (DRAPES) IMPLANT
DRAPE C-ARMOR (DRAPES) ×3 IMPLANT
DRAPE OEC MINIVIEW 54X84 (DRAPES) ×3 IMPLANT
DRAPE U-SHAPE 47X51 STRL (DRAPES) ×3 IMPLANT
DRSG ADAPTIC 3X8 NADH LF (GAUZE/BANDAGES/DRESSINGS) ×3 IMPLANT
DRSG EMULSION OIL 3X3 NADH (GAUZE/BANDAGES/DRESSINGS) ×2 IMPLANT
ELECT REM PT RETURN 9FT ADLT (ELECTROSURGICAL) ×3
ELECTRODE REM PT RTRN 9FT ADLT (ELECTROSURGICAL) ×1 IMPLANT
EVACUATOR 1/8 PVC DRAIN (DRAIN) IMPLANT
GAUZE SPONGE 4X4 12PLY STRL (GAUZE/BANDAGES/DRESSINGS) ×3 IMPLANT
GLOVE BIO SURGEON STRL SZ7.5 (GLOVE) ×3 IMPLANT
GLOVE BIO SURGEON STRL SZ8 (GLOVE) ×3 IMPLANT
GLOVE BIOGEL PI IND STRL 7.5 (GLOVE) ×1 IMPLANT
GLOVE BIOGEL PI IND STRL 8 (GLOVE) ×1 IMPLANT
GLOVE BIOGEL PI INDICATOR 7.5 (GLOVE) ×2
GLOVE BIOGEL PI INDICATOR 8 (GLOVE) ×2
GOWN STRL REUS W/ TWL LRG LVL3 (GOWN DISPOSABLE) ×2 IMPLANT
GOWN STRL REUS W/ TWL XL LVL3 (GOWN DISPOSABLE) ×1 IMPLANT
GOWN STRL REUS W/TWL LRG LVL3 (GOWN DISPOSABLE) ×6
GOWN STRL REUS W/TWL XL LVL3 (GOWN DISPOSABLE) ×3
KIT BASIN OR (CUSTOM PROCEDURE TRAY) ×3 IMPLANT
KIT ROOM TURNOVER OR (KITS) ×3 IMPLANT
MANIFOLD NEPTUNE II (INSTRUMENTS) ×3 IMPLANT
NEEDLE 22X1 1/2 (OR ONLY) (NEEDLE) IMPLANT
NS IRRIG 1000ML POUR BTL (IV SOLUTION) ×3 IMPLANT
PACK ORTHO EXTREMITY (CUSTOM PROCEDURE TRAY) ×3 IMPLANT
PAD ARMBOARD 7.5X6 YLW CONV (MISCELLANEOUS) ×6 IMPLANT
PADDING CAST COTTON 6X4 STRL (CAST SUPPLIES) ×9 IMPLANT
SPONGE GAUZE 4X4 12PLY STER LF (GAUZE/BANDAGES/DRESSINGS) ×2 IMPLANT
SPONGE LAP 18X18 X RAY DECT (DISPOSABLE) ×3 IMPLANT
SPONGE SCRUB IODOPHOR (GAUZE/BANDAGES/DRESSINGS) ×3 IMPLANT
STAPLER VISISTAT 35W (STAPLE) IMPLANT
STOCKINETTE IMPERVIOUS LG (DRAPES) ×3 IMPLANT
STRIP CLOSURE SKIN 1/2X4 (GAUZE/BANDAGES/DRESSINGS) IMPLANT
SUCTION FRAZIER TIP 10 FR DISP (SUCTIONS) IMPLANT
SUT ETHILON 3 0 PS 1 (SUTURE) IMPLANT
SUT PDS AB 2-0 CT1 27 (SUTURE) IMPLANT
SUT VIC AB 0 CT1 27 (SUTURE)
SUT VIC AB 0 CT1 27XBRD ANBCTR (SUTURE) IMPLANT
SUT VIC AB 2-0 CT1 27 (SUTURE)
SUT VIC AB 2-0 CT1 TAPERPNT 27 (SUTURE) IMPLANT
SYR CONTROL 10ML LL (SYRINGE) IMPLANT
TOWEL OR 17X24 6PK STRL BLUE (TOWEL DISPOSABLE) ×6 IMPLANT
TOWEL OR 17X26 10 PK STRL BLUE (TOWEL DISPOSABLE) ×6 IMPLANT
TUBE CONNECTING 12'X1/4 (SUCTIONS) ×1
TUBE CONNECTING 12X1/4 (SUCTIONS) ×2 IMPLANT
UNDERPAD 30X30 INCONTINENT (UNDERPADS AND DIAPERS) ×3 IMPLANT
WATER STERILE IRR 1000ML POUR (IV SOLUTION) ×6 IMPLANT
YANKAUER SUCT BULB TIP NO VENT (SUCTIONS) ×3 IMPLANT

## 2014-02-10 NOTE — Anesthesia Postprocedure Evaluation (Signed)
  Anesthesia Post-op Note  Patient: [name] [name]  Procedure(s) Performed: Procedure(s): HARDWARE REMOVAL OF CALCANEOUS (Left)  Patient Location: PACU  Anesthesia Type:General  Level of Consciousness: awake and alert   Airway and Oxygen Therapy: Patient Spontanous Breathing  Post-op Pain: none  Post-op Assessment: Post-op Vital signs reviewed  Post-op Vital Signs: Reviewed  Last Vitals:  Filed Vitals:   02/10/14 1000  BP: 128/66  Pulse: 79  Temp:   Resp: 16    Complications: No apparent anesthesia complications

## 2014-02-10 NOTE — Progress Notes (Signed)
Report given to maria rn as caregiver 
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
## 2014-02-10 NOTE — Progress Notes (Signed)
Pt. Reports that he had a bottle of water at 0430 this a.m. Pt. Stated, "if I wake up thirsty I am going to drink & I am not going to have a fight over this ".  Dr. Krista BlueSinger aware.

## 2014-02-10 NOTE — Brief Op Note (Signed)
02/10/2014  9:15 AM  PATIENT:  [name] [name]  54 y.o. male  PRE-OPERATIVE DIAGNOSIS:  symptomatic hardware left calcaneous  POST-OPERATIVE DIAGNOSIS:  painful hardware left calcaneous  PROCEDURE:  Procedure(s): HARDWARE REMOVAL OF CALCANEOUS (Left)  SURGEON:  Surgeon(s) and Role:    * Budd PalmerMichael H Owin Vignola, MD - Primary  PHYSICIAN ASSISTANT: None  ANESTHESIA:   local and general  I/O:  Total I/O In: 100 [I.V.:100] Out: -   SPECIMEN:  No Specimen  TOURNIQUET:    DICTATION: .Other Dictation: Dictation Number [id number]

## 2014-02-10 NOTE — Anesthesia Procedure Notes (Signed)
Date/Time: 02/10/2014 8:11 AM Performed by: Rogelia BogaMUELLER, Clarene Curran P Pre-anesthesia Checklist: Patient identified, Emergency Drugs available, Suction available, Patient being monitored and Timeout performed Patient Re-evaluated:Patient Re-evaluated prior to inductionOxygen Delivery Method: Circle system utilized Preoxygenation: Pre-oxygenation with 100% oxygen Intubation Type: IV induction Ventilation: Mask ventilation without difficulty and Oral airway inserted - appropriate to patient size Laryngoscope Size: Mac and 4 Grade View: Grade II Tube type: Oral Tube size: 7.5 mm Number of attempts: 1 Airway Equipment and Method: Stylet Placement Confirmation: ETT inserted through vocal cords under direct vision,  positive ETCO2 and breath sounds checked- equal and bilateral Secured at: 23 cm Tube secured with: Tape Dental Injury: Teeth and Oropharynx as per pre-operative assessment

## 2014-02-10 NOTE — Discharge Instructions (Addendum)

## 2014-02-10 NOTE — Anesthesia Preprocedure Evaluation (Addendum)
Anesthesia Evaluation  Patient identified by MRN, date of birth, ID band Patient awake    Reviewed: Allergy & Precautions, NPO status , Patient's Chart, lab work & pertinent test results  Airway Mallampati: II  TM Distance: >3 FB Neck ROM: Full    Dental  (+) Teeth Intact, Dental Advisory Given   Pulmonary Current Smoker,          Cardiovascular hypertension,     Neuro/Psych    GI/Hepatic   Endo/Other  diabetes, Type 2, Oral Hypoglycemic AgentsMorbid obesity  Renal/GU      Musculoskeletal   Abdominal   Peds  Hematology   Anesthesia Other Findings   Reproductive/Obstetrics                            Anesthesia Physical Anesthesia Plan  ASA: III  Anesthesia Plan: General   Post-op Pain Management:    Induction: Intravenous  Airway Management Planned: LMA  Additional Equipment:   Intra-op Plan:   Post-operative Plan: Extubation in OR  Informed Consent: I have reviewed the patients History and Physical, chart, labs and discussed the procedure including the risks, benefits and alternatives for the proposed anesthesia with the patient or authorized representative who has indicated his/her understanding and acceptance.   Dental advisory given  Plan Discussed with: CRNA  Anesthesia Plan Comments:        Anesthesia Quick Evaluation

## 2014-02-10 NOTE — Transfer of Care (Signed)
Immediate Anesthesia Transfer of Care Note  Patient: [name] [name]  Procedure(s) Performed: Procedure(s): HARDWARE REMOVAL OF CALCANEOUS (Left)  Patient Location: PACU  Anesthesia Type:General  Level of Consciousness: awake, alert , oriented and patient cooperative  Airway & Oxygen Therapy: Patient Spontanous Breathing and Patient connected to nasal cannula oxygen  Post-op Assessment: Report given to PACU RN, Post -op Vital signs reviewed and stable and Patient moving all extremities X 4  Post vital signs: Reviewed and stable  Complications: No apparent anesthesia complications

## 2014-02-11 ENCOUNTER — Encounter (HOSPITAL_COMMUNITY): Payer: Self-pay | Admitting: Orthopedic Surgery

## 2015-09-23 NOTE — H&P (Signed)
[name] RollingCharles Fasnacht is an 55 y.o. male.    Chief Complaint: S/P right distal femur fx with previous ORIF and OA / knee pain   HPI: Pt is a 55 y.o. male complaining of right knee pain for 3+ years. Pain had continually increased since the beginning. X-rays in the clinic show end-stage arthritic changes of the right knee and plates from previous ORIF. Pt has tried various conservative treatments which have failed to alleviate their symptoms, including analgesic medications, cortisone injection, hyaluronic acid injections, assistance devices and activity modification . Various options are discussed with the patient.  Patient plans on getting th hardware removed and once the area has healed he will proceed with a TKA.  Risks, benefits and expectations were discussed with the patient. Patient understand the risks, benefits and expectations and wishes to proceed with surgery.    PCP: Aura DialsBOUSKA,DAVID E, MD  D/C Plans:      Home  Post-op Meds:       No Rx given  Tranexamic Acid:      To be given - IV   Decadron:      Is to be given  FYI:     ASA  Norco  Nicotine Patch 21 mg   PMH: Past Medical History:  Diagnosis Date  . Diabetes   . Hypercholesterolemia   . Metabolic syndrome   . Nicotine dependence 04/03/2012  . Obesity, unspecified 04/03/2012  . 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 Unspecified vitamin D deficiency 04/03/2012    PSH: Past Surgical History:  Procedure Laterality Date  . ANKLE FRACTURE SURGERY  2010  . APPENDECTOMY    . COLONOSCOPY W/ BIOPSIES AND POLYPECTOMY    . EXTERNAL FIXATION LEG  04/01/2012   ex fix R tibial plateau fracture  . HARDWARE REMOVAL Left 02/10/2014   Procedure: HARDWARE REMOVAL OF CALCANEOUS;  Surgeon: Budd PalmerMichael H Handy, MD;  [facility];  Service: Orthopedics;  Laterality: Left;  . ORIF FEMUR FRACTURE Left 04/06/2012   Procedure: OPEN REDUCTION INTERNAL FIXATION (ORIF) DISTAL FEMUR FRACTURE;  Surgeon: Budd PalmerMichael H Handy, MD;  Location: [facility] OR;  Service:  Orthopedics;  Laterality: Left;  Biomet Proximal Tibia Plate, 7.3 Cannulated screws, Synthes small fragment set.  . ORIF FEMUR FRACTURE Right 08/12/2013   Procedure: OPEN REDUCTION INTERNAL FIXATION (ORIF) RIGHT DISTAL FEMUR FRACTURE;  Surgeon: Budd PalmerMichael H Handy, MD;  Location: [facility] OR;  Service: Orthopedics;  Laterality: Right;  . ORIF TIBIA PLATEAU Right 04/01/2012   Procedure: TIBIAL PLATEAU/External Fixation;  Surgeon: Mable ParisJustin William Chandler, MD;  [facility];  Service: Orthopedics;  Laterality: Right;  . ORIF TIBIA PLATEAU Right 04/14/2012   Procedure: OPEN REDUCTION INTERNAL FIXATION (ORIF) TIBIAL PLATEAU;  Surgeon: Budd PalmerMichael H Handy, MD;  Location: [facility] OR;  Service: Orthopedics;  Laterality: Right;    Social History:  reports that he has been smoking Cigarettes.  He has a 45.00 pack-year smoking history. He has never used smokeless tobacco. He reports that he drinks about 3.6 - 4.8 oz of alcohol per week . He reports that he uses drugs, including Marijuana.  Allergies:  No Known Allergies  Medications: No current facility-administered medications for this encounter.    Current Outpatient Prescriptions  Medication Sig Dispense Refill  . aspirin 81 MG chewable tablet Chew 1 tablet (81 mg total) by mouth daily.    Marland Kitchen. CALCIUM PO Take 1 tablet by mouth daily.    Marland Kitchen. docusate sodium 100 MG CAPS Take 100 mg by mouth 2 (two) times daily. (Patient not  taking: Reported on 02/09/2014) 10 capsule 0  . erythromycin (E-MYCIN) 250 MG tablet Take 2 tablets (500 mg total) by mouth 2 (two) times daily. (Patient not taking: Reported on 02/09/2014) 40 tablet 0  . lisinopril (PRINIVIL,ZESTRIL) 10 MG tablet Take 1 tablet (10 mg total) by mouth daily. 30 tablet 0  . metFORMIN (GLUCOPHAGE) 500 MG tablet Take 1 tablet (500 mg total) by mouth daily with breakfast. 60 tablet 0  . methocarbamol (ROBAXIN) 500 MG tablet Take 1-2 tablets (500-1,000 mg total) by mouth every 6 (six) hours as needed for muscle spasms. 90 tablet 0    . Multiple Vitamins-Minerals (MULTIVITAMIN PO) Take 1 tablet by mouth daily.    . Omega-3 Fatty Acids (FISH OIL PO) Take 1 tablet by mouth daily.    . ondansetron (ZOFRAN) 4 MG tablet Take 1-2 tablets (4-8 mg total) by mouth every 8 (eight) hours as needed for nausea or vomiting. 20 tablet 0  . oxyCODONE-acetaminophen (ROXICET) 5-325 MG per tablet Take 1-2 tablets by mouth every 8 (eight) hours as needed for severe pain. 40 tablet 0  . pravastatin (PRAVACHOL) 40 MG tablet Take 40 mg by mouth daily.    . traMADol (ULTRAM) 50 MG tablet Take 1-2 tablets (50-100 mg total) by mouth every 6 (six) hours as needed. 40 tablet 0      Review of Systems  Constitutional: Negative.   HENT: Negative.   Eyes: Negative.   Respiratory: Negative.   Cardiovascular: Negative.   Gastrointestinal: Negative.   Genitourinary: Negative.   Musculoskeletal: Positive for joint pain.  Skin: Negative.   Neurological: Negative.   Endo/Heme/Allergies: Negative.   Psychiatric/Behavioral: Negative.        Physical Exam  Constitutional: He is oriented to person, place, and time. He appears well-developed.  HENT:  Head: Normocephalic.  Eyes: Pupils are equal, round, and reactive to light.  Neck: Neck supple. No JVD present. No tracheal deviation present. No thyromegaly present.  Cardiovascular: Normal rate, regular rhythm, normal heart sounds and intact distal pulses.   Respiratory: Effort normal and breath sounds normal. No respiratory distress. He has no wheezes.  GI: Soft. There is no tenderness. There is no guarding.  Musculoskeletal:       Right knee: He exhibits decreased range of motion, swelling, laceration (healed previous incision) and bony tenderness. He exhibits no ecchymosis, no deformity and no erythema. Tenderness found.  Lymphadenopathy:    He has no cervical adenopathy.  Neurological: He is alert and oriented to person, place, and time. A sensory deficit (bilateral DM neuropathy) is present.   Skin: Skin is warm and dry.  Psychiatric: He has a normal mood and affect.       Assessment/Plan Assessment:   S/P right distal femur fx with previous ORIF and OA / knee pain  Plan: Patient will undergo a removal of orthopaedic implants of the right femur on 10/16/2015 [facility] Dr[facility]. Risks benefits and expectations were discussed with the patient. Patient understand risks, benefits and expectations and wishes to proceed.   Anastasio AuerbachMatthew S. Dorla Guizar   PA-C  09/23/2015, 10:16 PM

## 2015-10-06 ENCOUNTER — Encounter (HOSPITAL_COMMUNITY): Payer: Self-pay

## 2015-10-06 NOTE — Patient Instructions (Addendum)
[name] [name]  10/06/2015   Your procedure is scheduled on: 10/16/15  [facility]  [place]  elevators to 3rd floor to  Short Stay Center at  0530 AM.  Call this number if you have problems the morning of surgery [phone]   Remember: ONLY 1 PERSON MAY GO WITH YOU TO SHORT STAY TO GET  READY MORNING OF YOUR SURGERY.  Do not eat food or drink liquids :After Midnight.     Take these medicines the morning of surgery with A SIP OF WATER: NO REGULAR MEDICATIONS  DO NOT TAKE ANY DIABETIC MEDICATIONS DAY OF YOUR SURGERY                               You may not have any metal on your body including hair pins and              piercings  Do not wear jewelry, make-up, lotions, powders or perfumes, deodorant             Do not wear nail polish.  Do not shave  48 hours prior to surgery.              Men may shave face and neck.   Do not bring valuables to the hospital. [facility] IS NOT             RESPONSIBLE   FOR VALUABLES.  Contacts, dentures or bridgework may not be worn into surgery.  Leave suitcase in the car. After surgery it may be brought to your room.             [facility] - Preparing for Surgery Before surgery, you can play an important role.  Because skin is not sterile, your skin needs to be as free of germs as possible.  You can reduce the number of germs on your skin by washing with CHG (chlorahexidine gluconate) soap before surgery.  CHG is an antiseptic cleaner which kills germs and bonds with the skin to continue killing germs even after washing. Please DO NOT use if you have an allergy to CHG or antibacterial soaps.  If your skin becomes reddened/irritated stop using the CHG and inform your nurse when you arrive at Short Stay. Do not shave (including legs and underarms) for at least 48 hours prior to the first CHG shower.  You may shave your face/neck. Please follow these instructions carefully:  1.  Shower with CHG Soap  the night before surgery and the  morning of Surgery.  2.  If you choose to wash your hair, wash your hair first as usual with your  normal  shampoo.  3.  After you shampoo, rinse your hair and body thoroughly to remove the  shampoo.                           4.  Use CHG as you would any other liquid soap.  You can apply chg directly  to the skin and wash                       Gently with a scrungie or clean washcloth.  5.  Apply the CHG Soap to your body ONLY FROM THE NECK DOWN.   Do not use on face/ open  Wound or open sores. Avoid contact with eyes, ears mouth and genitals (private parts).                       Wash face,  Genitals (private parts) with your normal soap.             6.  Wash thoroughly, paying special attention to the area where your surgery  will be performed.  7.  Thoroughly rinse your body with warm water from the neck down.  8.  DO NOT shower/wash with your normal soap after using and rinsing off  the CHG Soap.                9.  Pat yourself dry with a clean towel.            10.  Wear clean pajamas.            11.  Place clean sheets on your bed the night of your first shower and do not  sleep with pets. Day of Surgery : Do not apply any lotions/deodorants the morning of surgery.  Please wear clean clothes to the hospital/surgery center.  FAILURE TO FOLLOW THESE INSTRUCTIONS MAY RESULT IN THE CANCELLATION OF YOUR SURGERY PATIENT SIGNATURE_________________________________  NURSE SIGNATURE__________________________________  ________________________________________________________________________

## 2015-10-06 NOTE — Progress Notes (Signed)
ekg 1/17 epic

## 2015-10-10 ENCOUNTER — Encounter (HOSPITAL_COMMUNITY)
Admission: RE | Admit: 2015-10-10 | Discharge: 2015-10-10 | Disposition: A | Discharge status: Home / Self Care | Payer: BLUE CROSS/BLUE SHIELD | Source: Ambulatory Visit | Attending: Orthopedic Surgery | Admitting: Orthopedic Surgery

## 2015-10-10 ENCOUNTER — Encounter (HOSPITAL_COMMUNITY): Payer: Self-pay

## 2015-10-10 DIAGNOSIS — E7800 Pure hypercholesterolemia, unspecified: Secondary | ICD-10-CM | POA: Diagnosis not present

## 2015-10-10 DIAGNOSIS — Z7984 Long term (current) use of oral hypoglycemic drugs: Secondary | ICD-10-CM | POA: Insufficient documentation

## 2015-10-10 DIAGNOSIS — Z0183 Encounter for blood typing: Secondary | ICD-10-CM | POA: Diagnosis not present

## 2015-10-10 DIAGNOSIS — Z6839 Body mass index (BMI) 39.0-39.9, adult: Secondary | ICD-10-CM | POA: Insufficient documentation

## 2015-10-10 DIAGNOSIS — E119 Type 2 diabetes mellitus without complications: Secondary | ICD-10-CM | POA: Insufficient documentation

## 2015-10-10 DIAGNOSIS — Z79899 Other long term (current) drug therapy: Secondary | ICD-10-CM | POA: Insufficient documentation

## 2015-10-10 DIAGNOSIS — Z7982 Long term (current) use of aspirin: Secondary | ICD-10-CM | POA: Insufficient documentation

## 2015-10-10 DIAGNOSIS — Z01812 Encounter for preprocedural laboratory examination: Secondary | ICD-10-CM | POA: Insufficient documentation

## 2015-10-10 DIAGNOSIS — F17210 Nicotine dependence, cigarettes, uncomplicated: Secondary | ICD-10-CM | POA: Insufficient documentation

## 2015-10-10 DIAGNOSIS — E669 Obesity, unspecified: Secondary | ICD-10-CM | POA: Diagnosis not present

## 2015-10-10 DIAGNOSIS — M25561 Pain in right knee: Secondary | ICD-10-CM | POA: Diagnosis not present

## 2015-10-10 HISTORY — DX: Unspecified osteoarthritis, unspecified site: M19.90

## 2015-10-10 HISTORY — DX: Essential (primary) hypertension: I10

## 2015-10-10 LAB — BASIC METABOLIC PANEL
Anion gap: 8 (ref 5–15)
BUN: 15 mg/dL (ref 6–20)
CALCIUM: 9.2 mg/dL (ref 8.9–10.3)
CHLORIDE: 103 mmol/L (ref 101–111)
CO2: 26 mmol/L (ref 22–32)
CREATININE: 0.73 mg/dL (ref 0.61–1.24)
GFR calc Af Amer: >60 mL/min (ref >60)
GFR calc non Af Amer: >60 mL/min (ref >60)
GLUCOSE: 120 mg/dL — AB (ref 65–99)
Potassium: 4.7 mmol/L (ref 3.5–5.1)
Sodium: 137 mmol/L (ref 135–145)

## 2015-10-10 LAB — CBC
HCT: 44 % (ref 39.0–52.0)
Hemoglobin: 14.6 g/dL (ref 13.0–17.0)
MCH: 32.5 pg (ref 26.0–34.0)
MCHC: 33.2 g/dL (ref 30.0–36.0)
MCV: 98 fL (ref 78.0–100.0)
Platelets: 292 10*3/uL (ref 150–400)
RBC: 4.49 MIL/uL (ref 4.22–5.81)
RDW: 13.4 % (ref 11.5–15.5)
WBC: 7.6 10*3/uL (ref 4.0–10.5)

## 2015-10-10 LAB — ABO/RH: ABO/RH(D): A POS

## 2015-10-10 NOTE — Progress Notes (Signed)
   10/10/15 0821  OBSTRUCTIVE SLEEP APNEA  Have you ever been diagnosed with sleep apnea through a sleep study? No  Do you snore loudly (loud enough to be heard through closed doors)?  1  Do you often feel tired, fatigued, or sleepy during the daytime (such as falling asleep during driving or talking to someone)? 0 (occasionally)  Has anyone observed you stop breathing during your sleep? 0  Do you have, or are you being treated for high blood pressure? 1  BMI more than 35 kg/m2? 1  Age > 50 (1-yes) 1  Neck circumference greater than:Male 16 inches or larger, Male 17inches or larger? 1  Male Gender (Yes=1) 1  Obstructive Sleep Apnea Score 6  Score 5 or greater  Results sent to PCP

## 2015-10-10 NOTE — Progress Notes (Signed)
ADDENDUM--EKG WAS 1/16, not 1/17

## 2015-10-11 LAB — HEMOGLOBIN A1C
Hgb A1c MFr Bld: 6.7 % — ABNORMAL HIGH (ref 4.8–5.6)
Mean Plasma Glucose: 146 mg/dL

## 2015-10-16 ENCOUNTER — Encounter (HOSPITAL_COMMUNITY)
Admission: RE | Disposition: A | Discharge status: Home / Self Care | Payer: Self-pay | Source: Ambulatory Visit | Attending: Orthopedic Surgery

## 2015-10-16 ENCOUNTER — Ambulatory Visit (HOSPITAL_COMMUNITY): Payer: BLUE CROSS/BLUE SHIELD | Admitting: Certified Registered Nurse Anesthetist

## 2015-10-16 ENCOUNTER — Encounter (HOSPITAL_COMMUNITY): Payer: Self-pay | Admitting: *Deleted

## 2015-10-16 ENCOUNTER — Ambulatory Visit (HOSPITAL_COMMUNITY)
Admission: RE | Admit: 2015-10-16 | Discharge: 2015-10-17 | Disposition: A | Discharge status: Home / Self Care | Payer: BLUE CROSS/BLUE SHIELD | Source: Ambulatory Visit | Attending: Orthopedic Surgery | Admitting: Orthopedic Surgery

## 2015-10-16 DIAGNOSIS — Z7984 Long term (current) use of oral hypoglycemic drugs: Secondary | ICD-10-CM | POA: Diagnosis not present

## 2015-10-16 DIAGNOSIS — Z7982 Long term (current) use of aspirin: Secondary | ICD-10-CM | POA: Insufficient documentation

## 2015-10-16 DIAGNOSIS — I10 Essential (primary) hypertension: Secondary | ICD-10-CM | POA: Insufficient documentation

## 2015-10-16 DIAGNOSIS — E669 Obesity, unspecified: Secondary | ICD-10-CM | POA: Diagnosis not present

## 2015-10-16 DIAGNOSIS — Z6839 Body mass index (BMI) 39.0-39.9, adult: Secondary | ICD-10-CM | POA: Diagnosis not present

## 2015-10-16 DIAGNOSIS — M1731 Unilateral post-traumatic osteoarthritis, right knee: Secondary | ICD-10-CM | POA: Diagnosis not present

## 2015-10-16 DIAGNOSIS — Z472 Encounter for removal of internal fixation device: Secondary | ICD-10-CM | POA: Insufficient documentation

## 2015-10-16 DIAGNOSIS — Z9889 Other specified postprocedural states: Secondary | ICD-10-CM

## 2015-10-16 DIAGNOSIS — E7800 Pure hypercholesterolemia, unspecified: Secondary | ICD-10-CM | POA: Diagnosis not present

## 2015-10-16 DIAGNOSIS — E119 Type 2 diabetes mellitus without complications: Secondary | ICD-10-CM | POA: Insufficient documentation

## 2015-10-16 DIAGNOSIS — S72401S Unspecified fracture of lower end of right femur, sequela: Secondary | ICD-10-CM | POA: Diagnosis not present

## 2015-10-16 DIAGNOSIS — F17210 Nicotine dependence, cigarettes, uncomplicated: Secondary | ICD-10-CM | POA: Insufficient documentation

## 2015-10-16 DIAGNOSIS — Z79899 Other long term (current) drug therapy: Secondary | ICD-10-CM | POA: Diagnosis not present

## 2015-10-16 DIAGNOSIS — Z4689 Encounter for fitting and adjustment of other specified devices: Secondary | ICD-10-CM | POA: Diagnosis not present

## 2015-10-16 HISTORY — PX: HARDWARE REMOVAL: SHX979

## 2015-10-16 LAB — GLUCOSE, CAPILLARY
Glucose-Capillary: 156 mg/dL — ABNORMAL HIGH (ref 65–99)
Glucose-Capillary: 144 mg/dL — ABNORMAL HIGH (ref 65–99)
Glucose-Capillary: 212 mg/dL — ABNORMAL HIGH (ref 65–99)

## 2015-10-16 LAB — TYPE AND SCREEN
ABO/RH(D): A POS
Antibody Screen: NEGATIVE

## 2015-10-16 SURGERY — REMOVAL, HARDWARE
Anesthesia: Monitor Anesthesia Care | Site: Knee | Laterality: Right

## 2015-10-16 MED ORDER — DSS 100 MG PO CAPS: 100.0000 mg | ORAL_CAPSULE | Freq: Two times a day (BID) | ORAL | Status: DC

## 2015-10-16 MED ORDER — ACETAMINOPHEN 650 MG RE SUPP: 650.0000 mg | Freq: Four times a day (QID) | RECTAL | Status: DC | PRN

## 2015-10-16 MED ORDER — HYDROMORPHONE HCL 1 MG/ML IJ SOLN
INTRAMUSCULAR | Status: AC
  Administered 2015-10-16: 0.5 mg via INTRAVENOUS
  Filled 2015-10-16: qty 1

## 2015-10-16 MED ORDER — METOCLOPRAMIDE HCL 5 MG/ML IJ SOLN: 5.0000 mg | Freq: Three times a day (TID) | INTRAMUSCULAR | Status: DC | PRN

## 2015-10-16 MED ORDER — DEXAMETHASONE SODIUM PHOSPHATE 10 MG/ML IJ SOLN
10.0000 mg | Freq: Once | INTRAMUSCULAR | Status: AC
  Administered 2015-10-16: 10 mg via INTRAVENOUS

## 2015-10-16 MED ORDER — HYDROMORPHONE HCL 1 MG/ML IJ SOLN
0.5000 mg | INTRAMUSCULAR | Status: DC | PRN
Start: 2015-10-16 — End: 2015-10-17

## 2015-10-16 MED ORDER — ONDANSETRON HCL 4 MG/2ML IJ SOLN
INTRAMUSCULAR | Status: DC | PRN
  Administered 2015-10-16: 4 mg via INTRAVENOUS

## 2015-10-16 MED ORDER — LIDOCAINE HCL (CARDIAC) 20 MG/ML IV SOLN
INTRAVENOUS | Status: DC | PRN
  Administered 2015-10-16: 50 mg via INTRAVENOUS

## 2015-10-16 MED ORDER — 0.9 % SODIUM CHLORIDE (POUR BTL) OPTIME
TOPICAL | Status: DC | PRN
  Administered 2015-10-16: 1000 mL

## 2015-10-16 MED ORDER — ONDANSETRON HCL 4 MG/2ML IJ SOLN
INTRAMUSCULAR | Status: AC
  Filled 2015-10-16: qty 2

## 2015-10-16 MED ORDER — PRAVASTATIN SODIUM 20 MG PO TABS
40.0000 mg | ORAL_TABLET | Freq: Every day | ORAL | Status: DC
  Administered 2015-10-16 – 2015-10-17 (×2): 40 mg via ORAL
  Filled 2015-10-16 (×2): qty 2

## 2015-10-16 MED ORDER — METOCLOPRAMIDE HCL 5 MG PO TABS: 5.0000 mg | ORAL_TABLET | Freq: Three times a day (TID) | ORAL | Status: DC | PRN

## 2015-10-16 MED ORDER — PROPOFOL 10 MG/ML IV BOLUS
INTRAVENOUS | Status: AC
  Filled 2015-10-16: qty 20

## 2015-10-16 MED ORDER — KETAMINE HCL 10 MG/ML IJ SOLN
INTRAMUSCULAR | Status: AC
  Filled 2015-10-16: qty 1

## 2015-10-16 MED ORDER — ASPIRIN EC 325 MG PO TBEC
325.0000 mg | DELAYED_RELEASE_TABLET | Freq: Every day | ORAL | Status: DC
  Administered 2015-10-17: 325 mg via ORAL
  Filled 2015-10-16: qty 1

## 2015-10-16 MED ORDER — HYDROCODONE-ACETAMINOPHEN 7.5-325 MG PO TABS
1.0000 | ORAL_TABLET | ORAL | Status: DC | PRN
  Administered 2015-10-16 (×2): 2 via ORAL
  Administered 2015-10-16 (×2): 1 via ORAL
  Administered 2015-10-17 (×3): 2 via ORAL
  Filled 2015-10-16 (×2): qty 2
  Filled 2015-10-16: qty 1
  Filled 2015-10-16: qty 2
  Filled 2015-10-16: qty 1
  Filled 2015-10-16 (×2): qty 2

## 2015-10-16 MED ORDER — PHENYLEPHRINE 40 MCG/ML (10ML) SYRINGE FOR IV PUSH (FOR BLOOD PRESSURE SUPPORT)
PREFILLED_SYRINGE | INTRAVENOUS | Status: AC
  Filled 2015-10-16: qty 10

## 2015-10-16 MED ORDER — SODIUM CHLORIDE 0.9 % IV SOLN
INTRAVENOUS | Status: DC
  Administered 2015-10-16: 1000 mL via INTRAVENOUS
  Administered 2015-10-17: 01:00:00 via INTRAVENOUS

## 2015-10-16 MED ORDER — LIDOCAINE 2% (20 MG/ML) 5 ML SYRINGE
INTRAMUSCULAR | Status: AC
  Filled 2015-10-16: qty 5

## 2015-10-16 MED ORDER — PROPOFOL 10 MG/ML IV BOLUS
INTRAVENOUS | Status: AC
  Filled 2015-10-16: qty 60

## 2015-10-16 MED ORDER — ONDANSETRON HCL 4 MG/2ML IJ SOLN: 4.0000 mg | Freq: Four times a day (QID) | INTRAMUSCULAR | Status: DC | PRN

## 2015-10-16 MED ORDER — METHOCARBAMOL 500 MG PO TABS
500.0000 mg | ORAL_TABLET | Freq: Four times a day (QID) | ORAL | Status: DC | PRN
  Administered 2015-10-17 (×2): 500 mg via ORAL
  Filled 2015-10-16 (×2): qty 1

## 2015-10-16 MED ORDER — MIDAZOLAM HCL 2 MG/2ML IJ SOLN
INTRAMUSCULAR | Status: AC
  Filled 2015-10-16: qty 2

## 2015-10-16 MED ORDER — LACTATED RINGERS IV SOLN
INTRAVENOUS | Status: DC
  Administered 2015-10-16 (×2): via INTRAVENOUS

## 2015-10-16 MED ORDER — CEFAZOLIN IN D5W 1 GM/50ML IV SOLN
1.0000 g | Freq: Four times a day (QID) | INTRAVENOUS | Status: AC
  Administered 2015-10-16 – 2015-10-17 (×3): 1 g via INTRAVENOUS
  Filled 2015-10-16 (×3): qty 50

## 2015-10-16 MED ORDER — CALCIUM CARBONATE-VITAMIN D 500-200 MG-UNIT PO TABS
1.0000 | ORAL_TABLET | Freq: Every day | ORAL | Status: DC
  Administered 2015-10-16 – 2015-10-17 (×2): 1 via ORAL
  Filled 2015-10-16 (×2): qty 1

## 2015-10-16 MED ORDER — ONDANSETRON HCL 4 MG PO TABS: 4.0000 mg | ORAL_TABLET | Freq: Four times a day (QID) | ORAL | Status: DC | PRN

## 2015-10-16 MED ORDER — MIDAZOLAM HCL 5 MG/5ML IJ SOLN
INTRAMUSCULAR | Status: DC | PRN
  Administered 2015-10-16 (×2): 1 mg via INTRAVENOUS

## 2015-10-16 MED ORDER — DOCUSATE SODIUM 100 MG PO CAPS
100.0000 mg | ORAL_CAPSULE | Freq: Two times a day (BID) | ORAL | Status: DC
  Administered 2015-10-16 – 2015-10-17 (×2): 100 mg via ORAL
  Filled 2015-10-16 (×2): qty 1

## 2015-10-16 MED ORDER — PROPOFOL 500 MG/50ML IV EMUL
INTRAVENOUS | Status: DC | PRN
  Administered 2015-10-16: 40 ug/kg/min via INTRAVENOUS

## 2015-10-16 MED ORDER — ACETAMINOPHEN 325 MG PO TABS: 650.0000 mg | ORAL_TABLET | Freq: Four times a day (QID) | ORAL | Status: DC | PRN

## 2015-10-16 MED ORDER — POLYETHYLENE GLYCOL 3350 17 G PO PACK: 17.0000 g | PACK | Freq: Every day | ORAL | Status: DC | PRN

## 2015-10-16 MED ORDER — CEFAZOLIN SODIUM-DEXTROSE 2-4 GM/100ML-% IV SOLN
INTRAVENOUS | Status: AC
  Filled 2015-10-16: qty 100

## 2015-10-16 MED ORDER — HYDROMORPHONE HCL 1 MG/ML IJ SOLN
0.2500 mg | INTRAMUSCULAR | Status: DC | PRN
  Administered 2015-10-16 (×2): 0.5 mg via INTRAVENOUS

## 2015-10-16 MED ORDER — PROPOFOL 10 MG/ML IV BOLUS
INTRAVENOUS | Status: DC | PRN
  Administered 2015-10-16: 40 mg via INTRAVENOUS
  Administered 2015-10-16 (×2): 10 mg via INTRAVENOUS
  Administered 2015-10-16 (×5): 20 mg via INTRAVENOUS
  Administered 2015-10-16: 40 mg via INTRAVENOUS

## 2015-10-16 MED ORDER — BUPIVACAINE HCL (PF) 0.25 % IJ SOLN
INTRAMUSCULAR | Status: AC
  Filled 2015-10-16: qty 30

## 2015-10-16 MED ORDER — DEXAMETHASONE SODIUM PHOSPHATE 10 MG/ML IJ SOLN
INTRAMUSCULAR | Status: AC
  Filled 2015-10-16: qty 1

## 2015-10-16 MED ORDER — BUPIVACAINE IN DEXTROSE 0.75-8.25 % IT SOLN
INTRATHECAL | Status: DC | PRN
  Administered 2015-10-16: 15 mg via INTRATHECAL

## 2015-10-16 MED ORDER — BUPIVACAINE HCL (PF) 0.25 % IJ SOLN
INTRAMUSCULAR | Status: DC | PRN
  Administered 2015-10-16: 25 mL

## 2015-10-16 MED ORDER — METFORMIN HCL 500 MG PO TABS
500.0000 mg | ORAL_TABLET | Freq: Every day | ORAL | Status: DC
  Administered 2015-10-17: 500 mg via ORAL
  Filled 2015-10-16: qty 1

## 2015-10-16 MED ORDER — KETAMINE HCL 10 MG/ML IJ SOLN
INTRAMUSCULAR | Status: DC | PRN
  Administered 2015-10-16: 10 mg via INTRAVENOUS

## 2015-10-16 MED ORDER — TRANEXAMIC ACID 1000 MG/10ML IV SOLN
1000.0000 mg | INTRAVENOUS | Status: AC
  Administered 2015-10-16: 1000 mg via INTRAVENOUS
  Filled 2015-10-16: qty 1100

## 2015-10-16 MED ORDER — LISINOPRIL 10 MG PO TABS
10.0000 mg | ORAL_TABLET | Freq: Every day | ORAL | Status: DC
Start: 2015-10-16 — End: 2015-10-17
  Administered 2015-10-16 – 2015-10-17 (×2): 10 mg via ORAL
  Filled 2015-10-16 (×2): qty 1

## 2015-10-16 MED ORDER — PHENYLEPHRINE HCL 10 MG/ML IJ SOLN
INTRAMUSCULAR | Status: DC | PRN
  Administered 2015-10-16 (×2): 40 ug via INTRAVENOUS

## 2015-10-16 MED ORDER — METHOCARBAMOL 1000 MG/10ML IJ SOLN
500.0000 mg | Freq: Four times a day (QID) | INTRAVENOUS | Status: DC | PRN
  Administered 2015-10-16: 500 mg via INTRAVENOUS
  Filled 2015-10-16: qty 550
  Filled 2015-10-16: qty 5

## 2015-10-16 MED ORDER — CEFAZOLIN SODIUM-DEXTROSE 2-4 GM/100ML-% IV SOLN
2.0000 g | INTRAVENOUS | Status: AC
  Administered 2015-10-16: 2 g via INTRAVENOUS

## 2015-10-16 SURGICAL SUPPLY — 44 items
BAG SPEC THK2 15X12 ZIP CLS (MISCELLANEOUS) ×1
BAG ZIPLOCK 12X15 (MISCELLANEOUS) ×3 IMPLANT
BANDAGE ACE 6X5 VEL STRL LF (GAUZE/BANDAGES/DRESSINGS) ×3 IMPLANT
BANDAGE ESMARK 6X9 LF (GAUZE/BANDAGES/DRESSINGS) ×1 IMPLANT
BNDG CMPR 9X6 STRL LF SNTH (GAUZE/BANDAGES/DRESSINGS) ×1
BNDG ESMARK 6X9 LF (GAUZE/BANDAGES/DRESSINGS) ×3
COVER MAYO STAND STRL (DRAPES) IMPLANT
CUFF TOURN SGL QUICK 34 (TOURNIQUET CUFF) ×3
CUFF TRNQT CYL 34X4X40X1 (TOURNIQUET CUFF) ×1 IMPLANT
DRAPE C-ARM 42X120 X-RAY (DRAPES) IMPLANT
DRAPE EXTREMITY T 121X128X90 (DRAPE) IMPLANT
DRAPE POUCH INSTRU U-SHP 10X18 (DRAPES) ×3 IMPLANT
DRAPE STERI IOBAN 125X83 (DRAPES) ×3 IMPLANT
DRAPE U-SHAPE 47X51 STRL (DRAPES) ×3 IMPLANT
DURAPREP 26ML APPLICATOR (WOUND CARE) ×3 IMPLANT
ELECT REM PT RETURN 9FT ADLT (ELECTROSURGICAL) ×3
ELECTRODE REM PT RTRN 9FT ADLT (ELECTROSURGICAL) ×1 IMPLANT
GAUZE SPONGE 4X4 12PLY STRL (GAUZE/BANDAGES/DRESSINGS) ×3 IMPLANT
GAUZE XEROFORM 1X8 LF (GAUZE/BANDAGES/DRESSINGS) ×4 IMPLANT
GLOVE BIOGEL PI IND STRL 6.5 (GLOVE) IMPLANT
GLOVE BIOGEL PI IND STRL 7.5 (GLOVE) ×1 IMPLANT
GLOVE BIOGEL PI IND STRL 8 (GLOVE) IMPLANT
GLOVE BIOGEL PI IND STRL 8.5 (GLOVE) ×1 IMPLANT
GLOVE BIOGEL PI INDICATOR 6.5 (GLOVE) ×4
GLOVE BIOGEL PI INDICATOR 7.5 (GLOVE) ×4
GLOVE BIOGEL PI INDICATOR 8 (GLOVE) ×2
GLOVE BIOGEL PI INDICATOR 8.5 (GLOVE) ×2
GLOVE ORTHO TXT STRL SZ7.5 (GLOVE) ×6 IMPLANT
GLOVE SURG ORTHO 8.0 STRL STRW (GLOVE) ×3 IMPLANT
GOWN STRL REUS W/TWL LRG LVL3 (GOWN DISPOSABLE) ×3 IMPLANT
GOWN STRL REUS W/TWL XL LVL3 (GOWN DISPOSABLE) ×6 IMPLANT
KIT BASIN OR (CUSTOM PROCEDURE TRAY) ×3 IMPLANT
MANIFOLD NEPTUNE II (INSTRUMENTS) ×3 IMPLANT
PACK TOTAL JOINT (CUSTOM PROCEDURE TRAY) ×3 IMPLANT
PAD ABD 8X10 STRL (GAUZE/BANDAGES/DRESSINGS) ×6 IMPLANT
PADDING CAST COTTON 6X4 STRL (CAST SUPPLIES) ×3 IMPLANT
POSITIONER SURGICAL ARM (MISCELLANEOUS) ×3 IMPLANT
STAPLER VISISTAT 35W (STAPLE) IMPLANT
SUT MNCRL AB 4-0 PS2 18 (SUTURE) ×4 IMPLANT
SUT VIC AB 1 CT1 36 (SUTURE) ×9 IMPLANT
SUT VIC AB 2-0 CT1 27 (SUTURE) ×9
SUT VIC AB 2-0 CT1 TAPERPNT 27 (SUTURE) ×1 IMPLANT
TOWEL OR 17X26 10 PK STRL BLUE (TOWEL DISPOSABLE) ×6 IMPLANT
WRAP KNEE MAXI GEL POST OP (GAUZE/BANDAGES/DRESSINGS) ×2 IMPLANT

## 2015-10-16 NOTE — Evaluation (Signed)
Physical Therapy Evaluation Patient Details Name: [name] [name] MRN: [id number] DOB: Sep 12, 1960 Today's Date: 10/16/2015   History of Present Illness  Retained harware of the right knee in preparation for TKA.   Clinical Impression  The patient is  Asking for guidelines for exercises postop in preparation for TKA. Recommended that patient discuss with Dr. Charlann Boxerlin. Continue PT while in acute care. Pt admitted with above diagnosis. Pt currently with functional limitations due to the deficits listed below (see PT Problem List).  Pt will benefit from skilled PT to increase their independence and safety with mobility to allow discharge to the venue listed below.       Follow Up Recommendations Home health PT vs OP PT; per MD recommendation    Equipment Recommendations  None recommended by PT    Recommendations for Other Services       Precautions / Restrictions Precautions Precautions: Knee;Fall      Mobility  Bed Mobility Overal bed mobility: Needs Assistance Bed Mobility: Supine to Sit;Sit to Supine     Supine to sit: Min assist Sit to supine: Min assist   General bed mobility comments: assist with right leg  Transfers Overall transfer level: Needs assistance Equipment used: Rolling walker (2 wheeled) Transfers: Sit to/from Stand Sit to Stand: Min assist         General transfer comment: cues for hand and right leg  Ambulation/Gait Ambulation/Gait assistance: Min assist Ambulation Distance (Feet): 80 Feet Assistive device: Rolling walker (2 wheeled) Gait Pattern/deviations: Step-to pattern;Antalgic     General Gait Details: cues for sequence  Stairs            Wheelchair Mobility    Modified Rankin (Stroke Patients Only)       Balance                                             Pertinent Vitals/Pain Pain Assessment: 0-10 Pain Score: 5  Pain Location: R ight knee Pain Descriptors / Indicators:  Aching;Throbbing;Tightness;Discomfort Pain Intervention(s): Repositioned;Ice applied;Premedicated before session;Monitored during session;Limited activity within patient's tolerance    Home Living Family/patient expects to be discharged to:: Private residence Living Arrangements: Spouse/significant other Available Help at Discharge: Family Type of Home: House Home Access: Stairs to enter Entrance Stairs-Rails: None Secretary/administratorntrance Stairs-Number of Steps: 3 Home Layout: One level Home Equipment: Environmental consultantWalker - 2 wheels;Crutches;Cane - single point      Prior Function                 Hand Dominance        Extremity/Trunk Assessment   Upper Extremity Assessment: Defer to OT evaluation           Lower Extremity Assessment: RLE deficits/detail RLE Deficits / Details: + SLR, knee flexion 45*       Communication      Cognition Arousal/Alertness: Awake/alert Behavior During Therapy: WFL for tasks assessed/performed Overall Cognitive Status: Within Functional Limits for tasks assessed                      General Comments      Exercises        Assessment/Plan    PT Assessment Patient needs continued PT services  PT Diagnosis Difficulty walking;Acute pain   PT Problem List Decreased strength;Decreased range of motion;Decreased activity tolerance;Decreased mobility;Decreased knowledge of precautions;Decreased safety awareness;Decreased knowledge of use of  DME;Pain  PT Treatment Interventions DME instruction;Gait training;Stair training;Functional mobility training;Therapeutic activities;Therapeutic exercise;Patient/family education   PT Goals (Current goals can be found in the Care Plan section) Acute Rehab PT Goals Patient Stated Goal: to know what to do to prepare for my TKA PT Goal Formulation: With patient Time For Goal Achievement: 10/20/15 Potential to Achieve Goals: Good    Frequency 7X/week   Barriers to discharge        Co-evaluation                End of Session   Activity Tolerance: Patient tolerated treatment well Patient left: in bed;with call bell/phone within reach;with bed alarm set Nurse Communication: Mobility status    Functional Assessment Tool Used: clinical judgement Functional Limitation: Mobility: Walking and moving around Mobility: Walking and Moving Around Current Status (Z6109): At least 20 percent but less than 40 percent impaired, limited or restricted Mobility: Walking and Moving Around Goal Status [phone]): At least 1 percent but less than 20 percent impaired, limited or restricted    Time: 0981-1914 PT Time Calculation (min) (ACUTE ONLY): 20 min   Charges:   PT Evaluation $PT Eval Low Complexity: 1 Procedure     PT G Codes:   PT G-Codes **NOT FOR INPATIENT CLASS** Functional Assessment Tool Used: clinical judgement Functional Limitation: Mobility: Walking and moving around Mobility: Walking and Moving Around Current Status (N8295): At least 20 percent but less than 40 percent impaired, limited or restricted Mobility: Walking and Moving Around Goal Status [phone]): At least 1 percent but less than 20 percent impaired, limited or restricted    Rada Hay 10/16/2015, 6:24 PM

## 2015-10-16 NOTE — Anesthesia Preprocedure Evaluation (Addendum)
Anesthesia Evaluation  Patient identified by MRN, date of birth, ID band Patient awake    Reviewed: Allergy & Precautions, H&P , NPO status , Patient's Chart, lab work & pertinent test results  Airway Mallampati: III  TM Distance: >3 FB Neck ROM: Full    Dental no notable dental hx. (+) Teeth Intact, Dental Advisory Given   Pulmonary Current Smoker,    Pulmonary exam normal breath sounds clear to auscultation       Cardiovascular hypertension,  Rhythm:Regular Rate:Normal     Neuro/Psych negative neurological ROS  negative psych ROS   GI/Hepatic negative GI ROS, Neg liver ROS,   Endo/Other  diabetes, Type 2, Oral Hypoglycemic AgentsMorbid obesity  Renal/GU negative Renal ROS  negative genitourinary   Musculoskeletal  (+) Arthritis , Osteoarthritis,    Abdominal   Peds  Hematology negative hematology ROS (+) anemia ,   Anesthesia Other Findings   Reproductive/Obstetrics negative OB ROS                            Anesthesia Physical Anesthesia Plan  ASA: III  Anesthesia Plan: MAC and Spinal   Post-op Pain Management:    Induction: Intravenous  Airway Management Planned: Simple Face Mask  Additional Equipment:   Intra-op Plan:   Post-operative Plan:   Informed Consent: I have reviewed the patients History and Physical, chart, labs and discussed the procedure including the risks, benefits and alternatives for the proposed anesthesia with the patient or authorized representative who has indicated his/her understanding and acceptance.   Dental advisory given  Plan Discussed with: CRNA  Anesthesia Plan Comments:         Anesthesia Quick Evaluation

## 2015-10-16 NOTE — Op Note (Signed)
NAMEDONAVIN, [name] [name] NO.:  [id number]  MEDICAL RECORD NO.:  [id number]  LOCATION:  [facility]                         FACILITY:  [facility]  PHYSICIAN:  [name] Frankel. Charlann [name], M.D.  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  PROCEDURE:  Removal of hardware, right distal femur, right proximal tibia.  SURGEON:  [name] Frankel. Charlann [name], M.D.  ASSISTANT:  [name] Bitter. Chabon, PA-C.  Mr. [name] [name] was present for the entirety of the case from preoperative position, perioperative management, operative extremity, general facilitation of the case, and primary wound closure.  ANESTHESIA:  General.  SPECIMENS:  The patient's hardware was removed from the field and sent to pathology per routine to give the patient at a later date.  BLOOD LOSS:  Minimal.  TOURNIQUET TIME:  Utilized per anesthetic record.  COMPLICATION:  None apparent.  INDICATIONS FOR PROCEDURE:  Mr. [name] [name] is a 55 year old male, referred for evaluation of posttraumatic arthritis in the right knee.  We had lengthy discussions regarding his failed conservative measures and desired to proceed with more definitive options in the form of arthroplasty.  In the setting of his osteoarthritis and need for knee replacement, it was important to remove the hardware through his previously placed lateral based and medial incision to allow for wound to heal before proceeding with more definitive surgery.  The timing of which may be 8 weeks out depending on his wound healing.  Risks of infection, risks of fracture through screw holes discussed and reviewed.  The reason  and rationale for this staged procedure were discussed and reviewed. Consent was obtained.  PROCEDURE IN DETAIL:  The patient was brought to the operative theater. Once adequate anesthesia, preoperative antibiotics, including Ancef as well as 1 g of tranexamic acid and Decadron administered.  He was positioned supine and right thigh tourniquet was placed.  The right lower extremity was then prepped and draped in sterile fashion.  A time- out was performed identifying the patient, planned procedure, and extremity.  The patient was noted have medial based incision for posterior medial plate in the proximal tibia and then a slightly curved longitudinal incision over the lateral aspect to incorporate to the distal femur and proximal tibia.  First attended to the proximal medial tibia.  Skin incision was made. Soft tissue planes created.  We were able to identify the plate and removed the screws and plate without difficulty here.  Use of a curette to remove any bony fragments and a soft tissue debris was carried out including some sutures that were placed.  Following this, this wound was packed and we addressed laterally.  A long longitudinal incision was made identifying the distal femoral plate and removing the screws here including fat in the plate as well as 2 lag screws were removed without difficulty.  The proximal tibial plate was exposed by elevating the lateral compartment musculature.  The soft tissue planes were elevated and the plate exposed, and the plate removed without difficulty.  Following removal of all the plate and screws, the wounds were irrigated with normal saline solution.  Each wound was closed in layers with #1 Vicryl on the fascial layer, 2-0 Vicryl, and then staples on the skin. The skin was cleaned, dried, and dressed sterilely, bulky sterile wrap with Xeroform dressing.  Postoperatively, he will be in the hospital overnight for antibiotics and pain  control.  He will be weightbearing as tolerated.  We will see him back in the office in 2 weeks for staple removal and discuss plans for further treatment.     [name] FrankelMatthew D. Charlann Boxerlin, M.D.     MDO/MEDQ  D:  10/16/2015  T:  10/16/2015  Job:  [id number]

## 2015-10-16 NOTE — Transfer of Care (Signed)
Immediate Anesthesia Transfer of Care Note  Patient: Marthenia RollingCharles Levitt  Procedure(s) Performed: Procedure(s): RIGHT REMOVAL OF IMPLANTS DISTAL FEMUR AND PROXIMAL TIBIA (Right)  Patient Location: PACU  Anesthesia Type:Spinal  Level of Consciousness: Patient easily awoken, sedated, comfortable, cooperative, following commands, responds to stimulation.   Airway & Oxygen Therapy: Patient spontaneously breathing, ventilating well, oxygen via simple oxygen mask.  Post-op Assessment: Report given to PACU RN, vital signs reviewed and stable, moving all extremities.   Post vital signs: Reviewed and stable.  Complications: No apparent anesthesia complications  Last Vitals:  Vitals:   10/16/15 0540  BP: (!) 170/97  Pulse: 86  Resp: 18  Temp: 36.7 C    Last Pain:  Vitals:   10/16/15 0540  TempSrc: Oral      Patients Stated Pain Goal: 4 (10/16/15 0556)  Complications: No apparent anesthesia complications

## 2015-10-16 NOTE — Interval H&P Note (Signed)
History and Physical Interval Note:  10/16/2015 7:16 AM  [name] [name]  has presented today for surgery, with the diagnosis of right distal femur ORIF  The various methods of treatment have been discussed with the patient and family. After consideration of risks, benefits and other options for treatment, the patient has consented to  Procedure(s): RIGHT REMOVAL OF IMPLANTS DISTAL FEMUR AND PROXIMAL FEMUR (Right) as a surgical intervention .  The patient's history has been reviewed, patient examined, no change in status, stable for surgery.  I have reviewed the patient's chart and labs.  Questions were answered to the patient's satisfaction.     Shelda PalLIN,Moe Graca D

## 2015-10-16 NOTE — Anesthesia Postprocedure Evaluation (Signed)
Anesthesia Post Note  Patient: [name] [name]  Procedure(s) Performed: Procedure(s) (LRB): RIGHT REMOVAL OF IMPLANTS DISTAL FEMUR AND PROXIMAL TIBIA (Right)  Patient location during evaluation: PACU Anesthesia Type: Spinal and MAC Level of consciousness: awake and alert Pain management: pain level controlled Vital Signs Assessment: post-procedure vital signs reviewed and stable Respiratory status: spontaneous breathing, respiratory function stable and patient connected to nasal cannula oxygen Cardiovascular status: blood pressure returned to baseline and stable Postop Assessment: spinal receding Anesthetic complications: no    Last Vitals:  Vitals:   10/16/15 1012 10/16/15 1023  BP: 133/87 (!) 143/91  Pulse: 75 78  Resp: 18 16  Temp:  36.6 C    Last Pain:  Vitals:   10/16/15 1012  TempSrc:   PainSc: 0-No pain                 Aaleah Hirsch,W. EDMOND

## 2015-10-16 NOTE — Interval H&P Note (Signed)
History and Physical Interval Note:  10/16/2015 7:15 AM  [name] [name]  has presented today for surgery, with the diagnosis of right distal femur ORIF  The various methods of treatment have been discussed with the patient and family. After consideration of risks, benefits and other options for treatment, the patient has consented to  Procedure(s): RIGHT REMOVAL OF IMPLANTS DISTAL FEMUR AND PROXIMAL FEMUR (Right) as a surgical intervention .  The patient's history has been reviewed, patient examined, no change in status, stable for surgery.  I have reviewed the patient's chart and labs.  Questions were answered to the patient's satisfaction.     Shelda PalLIN,Dosia Yodice D

## 2015-10-16 NOTE — Brief Op Note (Signed)
10/16/2015  9:08 AM  PATIENT:  Marthenia Rollingharles Hittle  55 y.o. male  PRE-OPERATIVE DIAGNOSIS:  Retained orthopaedic implants distal femur and proximal tibia in setting of post traumatic arthritis right knee  POST-OPERATIVE DIAGNOSIS:  Retained orthopaedic implants distal femur and proximal tibia in setting of post traumatic arthritis right knee  PROCEDURE:  Procedure(s): RIGHT REMOVAL OF IMPLANTS DISTAL FEMUR AND PROXIMAL TIBIA (Right)  SURGEON:  Surgeon(s) and Role:    * Durene RomansMatthew Terricka Onofrio, MD - Primary  PHYSICIAN ASSISTANT: Leilani AbleSteve Chabon, PA-C  ANESTHESIA:   general  EBL:  Total I/O In: 1000 [I.V.:1000] Out: 50 [Blood:50]  BLOOD ADMINISTERED:none  DRAINS: none   LOCAL MEDICATIONS USED:  MARCAINE     SPECIMEN:  No Specimen  DISPOSITION OF SPECIMEN:  N/A  COUNTS:  YES  TOURNIQUET:  * Missing tourniquet times found for documented tourniquets in log:  [id number] *  DICTATION: .Other Dictation: Dictation Number [id number]  PLAN OF CARE: Admit for overnight observation  PATIENT DISPOSITION:  PACU - hemodynamically stable.   Delay start of Pharmacological VTE agent (>24hrs) due to surgical blood loss or risk of bleeding: no

## 2015-10-16 NOTE — Anesthesia Procedure Notes (Signed)
Spinal  Patient location during procedure: OR Start time: 10/16/2015 7:22 AM End time: 10/16/2015 7:27 AM Staffing Anesthesiologist: Gaynelle AduFITZGERALD, Quay Simkin Performed: anesthesiologist  Preanesthetic Checklist Completed: patient identified, surgical consent, pre-op evaluation, timeout performed, IV checked, risks and benefits discussed and monitors and equipment checked Spinal Block Patient position: sitting Prep: Betadine Patient monitoring: cardiac monitor, continuous pulse ox and blood pressure Approach: midline Location: L3-4 Injection technique: single-shot Needle Needle type: Pencan  Needle gauge: 24 G Needle length: 9 cm Assessment Sensory level: T8 Additional Notes Functioning IV was confirmed and monitors were applied. Sterile prep and drape, including hand hygiene and sterile gloves were used. The patient was positioned and the spine was prepped. The skin was anesthetized with lidocaine.  Free flow of clear CSF was obtained prior to injecting local anesthetic into the CSF.  The spinal needle aspirated freely following injection.  The needle was carefully withdrawn.  The patient tolerated the procedure well.

## 2015-10-17 DIAGNOSIS — Z472 Encounter for removal of internal fixation device: Secondary | ICD-10-CM | POA: Diagnosis not present

## 2015-10-17 MED ORDER — HYDROCODONE-ACETAMINOPHEN 7.5-325 MG PO TABS: 1.0000 | ORAL_TABLET | ORAL | 0 refills | Status: DC | PRN

## 2015-10-17 MED ORDER — POLYETHYLENE GLYCOL 3350 17 G PO PACK: 17.0000 g | PACK | Freq: Every day | ORAL | 0 refills | Status: DC | PRN

## 2015-10-17 MED ORDER — METHOCARBAMOL 500 MG PO TABS: 500.0000 mg | ORAL_TABLET | Freq: Four times a day (QID) | ORAL | 0 refills | Status: DC | PRN

## 2015-10-17 NOTE — Progress Notes (Signed)
Physical Therapy Treatment Patient Details Name: [name] [name] MRN: [id number] DOB: August 04, 1960 Today's Date: 10/17/2015    History of Present Illness Retained harware of the right knee in preparation for TKA.     PT Comments    Assisted with amb and stair training.   Follow Up Recommendations  Home health PT;Supervision - Intermittent     Equipment Recommendations  None recommended by PT    Recommendations for Other Services       Precautions / Restrictions Precautions Precautions: Knee;Fall Restrictions Weight Bearing Restrictions: No Other Position/Activity Restrictions: WBAT    Mobility  Bed Mobility               General bed mobility comments: sitting EOB on arrival  Transfers Overall transfer level: Needs assistance Equipment used: Rolling walker (2 wheeled) Transfers: Sit to/from Stand Sit to Stand: Supervision;Min guard         General transfer comment: good use of hands to steady self  Ambulation/Gait Ambulation/Gait assistance: Supervision;Min guard Ambulation Distance (Feet): 65 Feet Assistive device: Rolling walker (2 wheeled) Gait Pattern/deviations: Step-to pattern;Step-through pattern     General Gait Details: <25% VC's on proper walker to self distance and safety with turns   Stairs Stairs: Yes Stairs assistance: Min assist Stair Management: No rails;Step to pattern;Forwards;Backwards;With crutches;With walker Number of Stairs: 4 General stair comments: performed twice.  Firast time up forward with B crutches but was too unstaedy so second time up backward with walker which was much safer.  Pt agreed.   Wheelchair Mobility    Modified Rankin (Stroke Patients Only)       Balance                                    Cognition Arousal/Alertness: Awake/alert Behavior During Therapy: WFL for tasks assessed/performed Overall Cognitive Status: Within Functional Limits for tasks assessed                       Exercises      General Comments        Pertinent Vitals/Pain Pain Assessment: 0-10 Pain Score: 6  Pain Location: R knee with activity Pain Descriptors / Indicators: Discomfort;Operative site guarding;Tender Pain Intervention(s): Monitored during session;Repositioned;Ice applied    Home Living                      Prior Function            PT Goals (current goals can now be found in the care plan section) Progress towards PT goals: Progressing toward goals    Frequency  7X/week    PT Plan Current plan remains appropriate    Co-evaluation             End of Session Equipment Utilized During Treatment: Gait belt Activity Tolerance: Patient tolerated treatment well Patient left: in chair;with call bell/phone within reach     Time: 1009-1034 PT Time Calculation (min) (ACUTE ONLY): 25 min  Charges:  $Gait Training: 8-22 mins $Therapeutic Activity: 8-22 mins                    G Codes:      Felecia ShellingLori [name] [name]  PTA [facility]  Acute  Rehab Pager      [phone]

## 2015-10-17 NOTE — Progress Notes (Signed)
RN reviewed discharge instructions with patient. All questions answered.    Paperwork and prescriptions given.  NT rolled patient down with family and all belongings.

## 2015-11-03 NOTE — Progress Notes (Signed)
Pt is being scheduled for preop appt; please place surgical orders in epic. Thanks.  

## 2015-11-13 NOTE — H&P (Signed)
TOTAL KNEE ADMISSION H&P  Patient is being admitted for right total knee arthroplasty.  Subjective:  Chief Complaint:    Right knee traumatic OA / pain  HPI: [name] [name], 55 y.o. male, has a history of pain and functional disability in the right knee due to trauma and arthritis and has failed non-surgical conservative treatments for greater than 12 weeks to include NSAID's and/or analgesics, corticosteriod injections, viscosupplementation injections, supervised PT with diminished ADL's post treatment, use of assistive devices and activity modification.  Onset of symptoms was gradual, starting 3+ years ago with gradually worsening course since that time. The patient noted prior procedures on the knee to include  ORIF and removal of hardware on the right knee(s).  Patient currently rates pain in the right knee(s) at 5 out of 10 with activity, more complaints of immobility. Patient has worsening of pain with activity and weight bearing, pain that interferes with activities of daily living, pain with passive range of motion, crepitus and joint swelling.  Patient has evidence of periarticular osteophytes and joint space narrowing by imaging studies. There is no active infection.   Risks, benefits and expectations were discussed with the patient.  Risks including but not limited to the risk of anesthesia, blood clots, nerve damage, blood vessel damage, failure of the prosthesis, infection and up to and including death.  Patient understand the risks, benefits and expectations and wishes to proceed with surgery.   PCP: Aura Dials, MD  D/C Plans:      Home with HHPT  Post-op Meds:       No Rx given  Tranexamic Acid:      To be given - IV   Decadron:      Is to be given  FYI:     ASA  Norco  Nicotine Patch 21 mg    Patient Active Problem List   Diagnosis Date Noted  . Status post hardware removal 10/16/2015  . Diabetes (HCC)   . Metabolic syndrome   . Blood glucose abnormal 08/12/2013  .  Fracture, femur, distal (HCC) 08/11/2013  . Screening for prostate cancer 04/20/2013  . Acute blood loss anemia 04/15/2012  . 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 Obesity, unspecified 04/03/2012  . Unspecified vitamin D deficiency 04/03/2012  . Fracture of tibial plateau, closed, Right 04/03/2012  . Fall 04/03/2012  . Obesity (BMI 30-39.9) 04/03/2012  . Closed fracture of left distal femur, Lateral femoral condyle 04/03/2012  . Calcaneus fracture, left 04/03/2012  . Nicotine dependence 04/03/2012   Past Medical History:  Diagnosis Date  . Arthritis   . Diabetes (HCC)   . Hypercholesterolemia   . Hypertension   . Metabolic syndrome   . Nicotine dependence 04/03/2012  . Obesity, unspecified 04/03/2012  . 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 Unspecified vitamin D deficiency 04/03/2012    Past Surgical History:  Procedure Laterality Date  . ANKLE FRACTURE SURGERY  2010  . APPENDECTOMY    . COLONOSCOPY W/ BIOPSIES AND POLYPECTOMY    . EXTERNAL FIXATION LEG  04/01/2012   ex fix R tibial plateau fracture  . HARDWARE REMOVAL Left 02/10/2014   Procedure: HARDWARE REMOVAL OF CALCANEOUS;  Surgeon: Budd Palmer, MD;  [facility];  Service: Orthopedics;  Laterality: Left;  . HARDWARE REMOVAL Right 10/16/2015   Procedure: RIGHT REMOVAL OF IMPLANTS DISTAL FEMUR AND PROXIMAL TIBIA;  Surgeon: Durene Romans, MD;  Location: [facility]S;  Service: Orthopedics;  Laterality: Right;  . ORIF FEMUR FRACTURE Left 04/06/2012   Procedure:  OPEN REDUCTION INTERNAL FIXATION (ORIF) DISTAL FEMUR FRACTURE;  Surgeon: Budd PalmerMichael H Handy, MD;  Location: [facility] OR;  Service: Orthopedics;  Laterality: Left;  Biomet Proximal Tibia Plate, 7.3 Cannulated screws, Synthes small fragment set.  . ORIF FEMUR FRACTURE Right 08/12/2013   Procedure: OPEN REDUCTION INTERNAL FIXATION (ORIF) RIGHT DISTAL FEMUR FRACTURE;  Surgeon: Budd PalmerMichael H Handy, MD;  Location: [facility] OR;  Service: Orthopedics;  Laterality: Right;  . ORIF TIBIA PLATEAU Right  04/01/2012   Procedure: TIBIAL PLATEAU/External Fixation;  Surgeon: Mable ParisJustin William Chandler, MD;  [facility];  Service: Orthopedics;  Laterality: Right;  . ORIF TIBIA PLATEAU Right 04/14/2012   Procedure: OPEN REDUCTION INTERNAL FIXATION (ORIF) TIBIAL PLATEAU;  Surgeon: Budd PalmerMichael H Handy, MD;  Location: [facility] OR;  Service: Orthopedics;  Laterality: Right;    No prescriptions prior to admission.   No Known Allergies   Social History  Substance Use Topics  . Smoking status: Current Every Day Smoker    Packs/day: 1.50    Years: 30.00    Types: Cigarettes  . Smokeless tobacco: Never Used  . Alcohol use 10.8 oz/week    18 Cans of beer per week    Family History  Problem Relation Age of Onset  . Other Mother   . Cancer - Colon Father      Review of Systems  Constitutional: Negative.   HENT: Negative.   Eyes: Negative.   Respiratory: Negative.   Cardiovascular: Negative.   Gastrointestinal: Negative.   Genitourinary: Negative.   Musculoskeletal: Positive for joint pain.  Skin: Negative.   Neurological: Negative.   Endo/Heme/Allergies: Negative.   Psychiatric/Behavioral: Negative.     Objective:  Physical Exam  Constitutional: He is oriented to person, place, and time. He appears well-developed.  HENT:  Head: Normocephalic.  Eyes: Pupils are equal, round, and reactive to light.  Neck: Neck supple. No JVD present. No tracheal deviation present. No thyromegaly present.  Cardiovascular: Normal rate, regular rhythm, normal heart sounds and intact distal pulses.   Respiratory: Effort normal and breath sounds normal. No respiratory distress. He has no wheezes.  GI: Soft. There is no tenderness. There is no guarding.  Musculoskeletal:       Right knee: He exhibits decreased range of motion, swelling, laceration (healing previous incisions) and bony tenderness. He exhibits no ecchymosis, no deformity and no erythema. Tenderness found.  Lymphadenopathy:    He has no cervical  adenopathy.  Neurological: He is alert and oriented to person, place, and time.  Skin: Skin is warm and dry.  Psychiatric: He has a normal mood and affect.     Labs:  Estimated body mass index is 39.63 kg/m as calculated from the following:   Height as of 10/16/15: 5\' 7"  (1.702 m).   Weight as of 10/16/15: 114.8 kg (253 lb).   Imaging Review Plain radiographs demonstrate severe degenerative joint disease of the right knee(s). The overall alignment isneutral. The bone quality appears to be fair for age and reported activity level.  Assessment/Plan:  End stage arthritis, right knee   The patient history, physical examination, clinical judgment of the provider and imaging studies are consistent with end stage degenerative joint disease of the right knee(s) and total knee arthroplasty is deemed medically necessary. The treatment options including medical management, injection therapy arthroscopy and arthroplasty were discussed at length. The risks and benefits of total knee arthroplasty were presented and reviewed. The risks due to aseptic loosening, infection, stiffness, patella tracking problems, thromboembolic complications and other imponderables were discussed.  The patient acknowledged the explanation, agreed to proceed with the plan and consent was signed. Patient is being admitted for inpatient treatment for surgery, pain control, PT, OT, prophylactic antibiotics, VTE prophylaxis, progressive ambulation and ADL's and discharge planning. The patient is planning to be discharged home with home health services.      Anastasio Auerbach Emilio Baylock   PA-C  11/13/2015, 8:29 AM

## 2015-11-15 NOTE — Patient Instructions (Signed)
[name] [name]  11/15/2015   Your procedure is scheduled on: 10/28/15  Report to [facility] Main  Entrance take [facility]  elevators to 3rd floor to  Short Stay Center at 1200 NOON  Call this number if you have problems the morning of surgery [phone]   Remember: ONLY 1 PERSON MAY GO WITH YOU TO SHORT STAY TO GET  READY MORNING OF YOUR SURGERY.  Do not eat foo :After Midnight.---MAY HAVE CLEAR LIQUIDS MORNING OF SURGERY UNTIL 0800 am---THEN NOTHING BY MOUTH     Take these medicines the morning of surgery with A SIP OF WATER: NOTHING DO NOT TAKE ANY DIABETIC MEDICATIONS DAY OF YOUR SURGERY                               You may not have any metal on your body including hair pins and              piercings  Do not wear jewelry, make-up, lotions, powders or perfumes, deodorant             Do not wear nail polish.  Do not shave  48 hours prior to surgery.              Men may shave face and neck.   Do not bring valuables to the hospital. [facility] IS NOT             RESPONSIBLE   FOR VALUABLES.  Contacts, dentures or bridgework may not be worn into surgery.  Leave suitcase in the car. After surgery it may be brought to your room.              [facility] - Preparing for Surgery Before surgery, you can play an important role.  Because skin is not sterile, your skin needs to be as free of germs as possible.  You can reduce the number of germs on your skin by washing with CHG (chlorahexidine gluconate) soap before surgery.  CHG is an antiseptic cleaner which kills germs and bonds with the skin to continue killing germs even after washing. Please DO NOT use if you have an allergy to CHG or antibacterial soaps.  If your skin becomes reddened/irritated stop using the CHG and inform your nurse when you arrive at Short Stay. Do not shave (including legs and underarms) for at least 48 hours prior to the first CHG shower.  You may shave your face/neck. Please follow these  instructions carefully:  1.  Shower with CHG Soap the night before surgery and the  morning of Surgery.  2.  If you choose to wash your hair, wash your hair first as usual with your  normal  shampoo.  3.  After you shampoo, rinse your hair and body thoroughly to remove the  shampoo.                           4.  Use CHG as you would any other liquid soap.  You can apply chg directly  to the skin and wash                       Gently with a scrungie or clean washcloth.  5.  Apply the CHG Soap to your body ONLY FROM THE NECK DOWN.  Do not use on face/ open                           Wound or open sores. Avoid contact with eyes, ears mouth and genitals (private parts).                       Wash face,  Genitals (private parts) with your normal soap.             6.  Wash thoroughly, paying special attention to the area where your surgery  will be performed.  7.  Thoroughly rinse your body with warm water from the neck down.  8.  DO NOT shower/wash with your normal soap after using and rinsing off  the CHG Soap.                9.  Pat yourself dry with a clean towel.            10.  Wear clean pajamas.            11.  Place clean sheets on your bed the night of your first shower and do not  sleep with pets. Day of Surgery : Do not apply any lotions/deodorants the morning of surgery.  Please wear clean clothes to the hospital/surgery center.  FAILURE TO FOLLOW THESE INSTRUCTIONS MAY RESULT IN THE CANCELLATION OF YOUR SURGERY PATIENT SIGNATURE_________________________________  NURSE SIGNATURE__________________________________  ________________________________________________________________________    CLEAR LIQUID DIET   Foods Allowed                                                                     Foods Excluded  Coffee and tea, regular and decaf                             liquids that you cannot  Plain Jell-O in any flavor                                             see through such  as: Fruit ices (not with fruit pulp)                                     milk, soups, orange juice  Iced Popsicles                                    All solid food Carbonated beverages, regular and diet                                    Cranberry, grape and apple juices Sports drinks like Gatorade Lightly seasoned clear broth or consume(fat free) Sugar, honey syrup  Sample Menu Breakfast  Lunch                                     Supper Cranberry juice                    Beef broth                            Chicken broth Jell-O                                     Grape juice                           Apple juice Coffee or tea                        Jell-O                                      Popsicle                                                Coffee or tea                        Coffee or tea  _____________________________________________________________________    Incentive Spirometer  An incentive spirometer is a tool that can help keep your lungs clear and active. This tool measures how well you are filling your lungs with each breath. Taking long deep breaths may help reverse or decrease the chance of developing breathing (pulmonary) problems (especially infection) following:  A long period of time when you are unable to move or be active. BEFORE THE PROCEDURE   If the spirometer includes an indicator to show your best effort, your nurse or respiratory therapist will set it to a desired goal.  If possible, sit up straight or lean slightly forward. Try not to slouch.  Hold the incentive spirometer in an upright position. INSTRUCTIONS FOR USE  1. Sit on the edge of your bed if possible, or sit up as far as you can in bed or on a chair. 2. Hold the incentive spirometer in an upright position. 3. Breathe out normally. 4. Place the mouthpiece in your mouth and seal your lips tightly around it. 5. Breathe in slowly and as deeply as possible,  raising the piston or the ball toward the top of the column. 6. Hold your breath for 3-5 seconds or for as long as possible. Allow the piston or ball to fall to the bottom of the column. 7. Remove the mouthpiece from your mouth and breathe out normally. 8. Rest for a few seconds and repeat Steps 1 through 7 at least 10 times every 1-2 hours when you are awake. Take your time and take a few normal breaths between deep breaths. 9. The spirometer may include an indicator to show your best effort. Use the indicator as a goal to work toward during each repetition. 10. After each set of 10 deep breaths,  practice coughing to be sure your lungs are clear. If you have an incision (the cut made at the time of surgery), support your incision when coughing by placing a pillow or rolled up towels firmly against it. Once you are able to get out of bed, walk around indoors and cough well. You may stop using the incentive spirometer when instructed by your caregiver.  RISKS AND COMPLICATIONS  Take your time so you do not get dizzy or light-headed.  If you are in pain, you may need to take or ask for pain medication before doing incentive spirometry. It is harder to take a deep breath if you are having pain. AFTER USE  Rest and breathe slowly and easily.  It can be helpful to keep track of a log of your progress. Your caregiver can provide you with a simple table to help with this. If you are using the spirometer at home, follow these instructions: SEEK MEDICAL CARE IF:   You are having difficultly using the spirometer.  You have trouble using the spirometer as often as instructed.  Your pain medication is not giving enough relief while using the spirometer.  You develop fever of 100.5 F (38.1 C) or higher. SEEK IMMEDIATE MEDICAL CARE IF:   You cough up bloody sputum that had not been present before.  You develop fever of 102 F (38.9 C) or greater.  You develop worsening pain at or near the  incision site. MAKE SURE YOU:   Understand these instructions.  Will watch your condition.  Will get help right away if you are not doing well or get worse. Document Released: 06/03/2006 Document Revised: 04/15/2011 Document Reviewed: 08/04/2006 Exit[employer] Patient Information 2014 [employer], [place].   ________________________________________________________________________  WHAT IS A BLOOD TRANSFUSION? Blood Transfusion Information  A transfusion is the replacement of blood or some of its parts. Blood is made up of multiple cells which provide different functions.  Red blood cells carry oxygen and are used for blood loss replacement.  White blood cells fight against infection.  Platelets control bleeding.  Plasma helps clot blood.  Other blood products are available for specialized needs, such as hemophilia or other clotting disorders. BEFORE THE TRANSFUSION  Who gives blood for transfusions?   Healthy volunteers who are fully evaluated to make sure their blood is safe. This is blood bank blood. Transfusion therapy is the safest it has ever been in the practice of medicine. Before blood is taken from a donor, a complete history is taken to make sure that person has no history of diseases nor engages in risky social behavior (examples are intravenous drug use or sexual activity with multiple partners). The donor's travel history is screened to minimize risk of transmitting infections, such as malaria. The donated blood is tested for signs of infectious diseases, such as HIV and hepatitis. The blood is then tested to be sure it is compatible with you in order to minimize the chance of a transfusion reaction. If you or a relative donates blood, this is often done in anticipation of surgery and is not appropriate for emergency situations. It takes many days to process the donated blood. RISKS AND COMPLICATIONS Although transfusion therapy is very safe and saves many lives, the main dangers  of transfusion include:   Getting an infectious disease.  Developing a transfusion reaction. This is an allergic reaction to something in the blood you were given. Every precaution is taken to prevent this. The decision to have a blood transfusion has been  considered carefully by your caregiver before blood is given. Blood is not given unless the benefits outweigh the risks. AFTER THE TRANSFUSION  Right after receiving a blood transfusion, you will usually feel much better and more energetic. This is especially true if your red blood cells have gotten low (anemic). The transfusion raises the level of the red blood cells which carry oxygen, and this usually causes an energy increase.  The nurse administering the transfusion will monitor you carefully for complications. HOME CARE INSTRUCTIONS  No special instructions are needed after a transfusion. You may find your energy is better. Speak with your caregiver about any limitations on activity for underlying diseases you may have. SEEK MEDICAL CARE IF:   Your condition is not improving after your transfusion.  You develop redness or irritation at the intravenous (IV) site. SEEK IMMEDIATE MEDICAL CARE IF:  Any of the following symptoms occur over the next 12 hours:  Shaking chills.  You have a temperature by mouth above 102 F (38.9 C), not controlled by medicine.  Chest, back, or muscle pain.  People around you feel you are not acting correctly or are confused.  Shortness of breath or difficulty breathing.  Dizziness and fainting.  You get a rash or develop hives.  You have a decrease in urine output.  Your urine turns a dark color or changes to pink, red, or brown. Any of the following symptoms occur over the next 10 days:  You have a temperature by mouth above 102 F (38.9 C), not controlled by medicine.  Shortness of breath.  Weakness after normal activity.  The white part of the eye turns yellow (jaundice).  You  have a decrease in the amount of urine or are urinating less often.  Your urine turns a dark color or changes to pink, red, or brown. Document Released: 01/19/2000 Document Revised: 04/15/2011 Document Reviewed: 09/07/2007 [facility] Patient Information 2014 [place], [place].  _______________________________________________________________________

## 2015-11-15 NOTE — Progress Notes (Signed)
Ekg, hgba1c 9/17 epic

## 2015-11-17 ENCOUNTER — Encounter (HOSPITAL_COMMUNITY): Payer: Self-pay

## 2015-11-17 ENCOUNTER — Encounter (HOSPITAL_COMMUNITY)
Admission: RE | Admit: 2015-11-17 | Discharge: 2015-11-17 | Disposition: A | Discharge status: Home / Self Care | Payer: BLUE CROSS/BLUE SHIELD | Source: Ambulatory Visit | Attending: Orthopedic Surgery | Admitting: Orthopedic Surgery

## 2015-11-17 DIAGNOSIS — M1731 Unilateral post-traumatic osteoarthritis, right knee: Secondary | ICD-10-CM | POA: Diagnosis not present

## 2015-11-17 DIAGNOSIS — Z0183 Encounter for blood typing: Secondary | ICD-10-CM | POA: Insufficient documentation

## 2015-11-17 DIAGNOSIS — Z01812 Encounter for preprocedural laboratory examination: Secondary | ICD-10-CM | POA: Diagnosis present

## 2015-11-17 LAB — CBC
HCT: 45.1 % (ref 39.0–52.0)
HEMOGLOBIN: 15.2 g/dL (ref 13.0–17.0)
MCH: 32.7 pg (ref 26.0–34.0)
MCHC: 33.7 g/dL (ref 30.0–36.0)
MCV: 97 fL (ref 78.0–100.0)
Platelets: 257 10*3/uL (ref 150–400)
RBC: 4.65 MIL/uL (ref 4.22–5.81)
RDW: 13.1 % (ref 11.5–15.5)
WBC: 6.5 10*3/uL (ref 4.0–10.5)

## 2015-11-17 LAB — BASIC METABOLIC PANEL
ANION GAP: 10 (ref 5–15)
BUN: 14 mg/dL (ref 6–20)
CALCIUM: 10.1 mg/dL (ref 8.9–10.3)
CHLORIDE: 102 mmol/L (ref 101–111)
CO2: 25 mmol/L (ref 22–32)
CREATININE: 0.75 mg/dL (ref 0.61–1.24)
GFR calc non Af Amer: >60 mL/min (ref >60)
Glucose, Bld: 116 mg/dL — ABNORMAL HIGH (ref 65–99)
Potassium: 4.8 mmol/L (ref 3.5–5.1)
Sodium: 137 mmol/L (ref 135–145)

## 2015-11-17 LAB — SURGICAL PCR SCREEN
MRSA, PCR: NEGATIVE
STAPHYLOCOCCUS AUREUS: NEGATIVE

## 2015-11-17 NOTE — Progress Notes (Signed)
   11/17/15 1019  OBSTRUCTIVE SLEEP APNEA  Have you ever been diagnosed with sleep apnea through a sleep study? No  Do you snore loudly (loud enough to be heard through closed doors)?  1  Do you often feel tired, fatigued, or sleepy during the daytime (such as falling asleep during driving or talking to someone)? 0  Has anyone observed you stop breathing during your sleep? 0  Do you have, or are you being treated for high blood pressure? 1  BMI more than 35 kg/m2? 1  Age > 50 (1-yes) 1  Neck circumference greater than:Male 16 inches or larger, Male 17inches or larger? 0  Male Gender (Yes=1) 1  Obstructive Sleep Apnea Score 5  Score 5 or greater  Results sent to PCP

## 2015-11-27 ENCOUNTER — Inpatient Hospital Stay (HOSPITAL_COMMUNITY)
Admission: RE | Admit: 2015-11-27 | Discharge: 2015-11-28 | DRG: 470 | Disposition: A | Discharge status: Home / Self Care | Payer: BLUE CROSS/BLUE SHIELD | Source: Ambulatory Visit | Attending: Orthopedic Surgery | Admitting: Orthopedic Surgery

## 2015-11-27 ENCOUNTER — Encounter (HOSPITAL_COMMUNITY): Payer: Self-pay | Admitting: *Deleted

## 2015-11-27 ENCOUNTER — Encounter (HOSPITAL_COMMUNITY)
Admission: RE | Disposition: A | Discharge status: Home / Self Care | Payer: Self-pay | Source: Ambulatory Visit | Attending: Orthopedic Surgery

## 2015-11-27 ENCOUNTER — Inpatient Hospital Stay (HOSPITAL_COMMUNITY): Payer: BLUE CROSS/BLUE SHIELD | Admitting: Certified Registered Nurse Anesthetist

## 2015-11-27 DIAGNOSIS — M1711 Unilateral primary osteoarthritis, right knee: Principal | ICD-10-CM | POA: Diagnosis present

## 2015-11-27 DIAGNOSIS — E669 Obesity, unspecified: Secondary | ICD-10-CM | POA: Diagnosis present

## 2015-11-27 DIAGNOSIS — Z96659 Presence of unspecified artificial knee joint: Secondary | ICD-10-CM

## 2015-11-27 DIAGNOSIS — Z6839 Body mass index (BMI) 39.0-39.9, adult: Secondary | ICD-10-CM | POA: Diagnosis not present

## 2015-11-27 DIAGNOSIS — Z96651 Presence of right artificial knee joint: Secondary | ICD-10-CM

## 2015-11-27 DIAGNOSIS — Z8679 Personal history of other diseases of the circulatory system: Secondary | ICD-10-CM | POA: Diagnosis not present

## 2015-11-27 DIAGNOSIS — F17210 Nicotine dependence, cigarettes, uncomplicated: Secondary | ICD-10-CM | POA: Diagnosis present

## 2015-11-27 DIAGNOSIS — Z8639 Personal history of other endocrine, nutritional and metabolic disease: Secondary | ICD-10-CM | POA: Diagnosis not present

## 2015-11-27 HISTORY — PX: TOTAL KNEE ARTHROPLASTY: SHX125

## 2015-11-27 LAB — GLUCOSE, CAPILLARY
GLUCOSE-CAPILLARY: 134 mg/dL — AB (ref 65–99)
GLUCOSE-CAPILLARY: 115 mg/dL — AB (ref 65–99)
GLUCOSE-CAPILLARY: 119 mg/dL — AB (ref 65–99)
GLUCOSE-CAPILLARY: 251 mg/dL — AB (ref 65–99)

## 2015-11-27 LAB — TYPE AND SCREEN
ABO/RH(D): A POS
ANTIBODY SCREEN: NEGATIVE

## 2015-11-27 SURGERY — ARTHROPLASTY, KNEE, TOTAL
Anesthesia: Monitor Anesthesia Care | Site: Knee | Laterality: Right

## 2015-11-27 MED ORDER — PROPOFOL 500 MG/50ML IV EMUL
INTRAVENOUS | Status: DC | PRN
  Administered 2015-11-27: 50 ug/kg/min via INTRAVENOUS

## 2015-11-27 MED ORDER — BISACODYL 10 MG RE SUPP: 10.0000 mg | Freq: Every day | RECTAL | Status: DC | PRN

## 2015-11-27 MED ORDER — CEFAZOLIN SODIUM-DEXTROSE 2-4 GM/100ML-% IV SOLN
2.0000 g | INTRAVENOUS | Status: AC
  Administered 2015-11-27: 2 g via INTRAVENOUS
  Filled 2015-11-27: qty 100

## 2015-11-27 MED ORDER — HYDROMORPHONE HCL 1 MG/ML IJ SOLN
INTRAMUSCULAR | Status: AC
  Filled 2015-11-27: qty 1

## 2015-11-27 MED ORDER — FERROUS SULFATE 325 (65 FE) MG PO TABS
325.0000 mg | ORAL_TABLET | Freq: Three times a day (TID) | ORAL | Status: DC
Start: 2015-11-27 — End: 2015-11-28
  Administered 2015-11-27 – 2015-11-28 (×2): 325 mg via ORAL
  Filled 2015-11-27 (×2): qty 1

## 2015-11-27 MED ORDER — PHENOL 1.4 % MT LIQD: 1.0000 | OROMUCOSAL | Status: DC | PRN

## 2015-11-27 MED ORDER — PRAVASTATIN SODIUM 20 MG PO TABS
40.0000 mg | ORAL_TABLET | Freq: Every day | ORAL | Status: DC
  Administered 2015-11-27: 40 mg via ORAL
  Filled 2015-11-27: qty 2

## 2015-11-27 MED ORDER — LACTATED RINGERS IV SOLN
INTRAVENOUS | Status: DC
  Administered 2015-11-27: 16:00:00 via INTRAVENOUS
  Administered 2015-11-27: 1000 mL via INTRAVENOUS

## 2015-11-27 MED ORDER — ONDANSETRON HCL 4 MG/2ML IJ SOLN: 4.0000 mg | Freq: Four times a day (QID) | INTRAMUSCULAR | Status: DC | PRN

## 2015-11-27 MED ORDER — DOCUSATE SODIUM 100 MG PO CAPS
100.0000 mg | ORAL_CAPSULE | Freq: Two times a day (BID) | ORAL | Status: DC
  Administered 2015-11-27 – 2015-11-28 (×2): 100 mg via ORAL
  Filled 2015-11-27 (×2): qty 1

## 2015-11-27 MED ORDER — METFORMIN HCL 500 MG PO TABS
500.0000 mg | ORAL_TABLET | Freq: Every day | ORAL | Status: DC
  Administered 2015-11-28: 500 mg via ORAL
  Filled 2015-11-27: qty 1

## 2015-11-27 MED ORDER — PROPOFOL 10 MG/ML IV BOLUS
INTRAVENOUS | Status: AC
  Filled 2015-11-27: qty 20

## 2015-11-27 MED ORDER — SODIUM CHLORIDE 0.9 % IJ SOLN
INTRAMUSCULAR | Status: DC | PRN
  Administered 2015-11-27: 50 mL via INTRAVENOUS

## 2015-11-27 MED ORDER — METOCLOPRAMIDE HCL 5 MG PO TABS: 5.0000 mg | ORAL_TABLET | Freq: Three times a day (TID) | ORAL | Status: DC | PRN

## 2015-11-27 MED ORDER — BUPIVACAINE HCL (PF) 0.25 % IJ SOLN
INTRAMUSCULAR | Status: DC | PRN
  Administered 2015-11-27: 30 mL

## 2015-11-27 MED ORDER — HYDROCODONE-ACETAMINOPHEN 7.5-325 MG PO TABS
1.0000 | ORAL_TABLET | ORAL | Status: DC
  Administered 2015-11-27 – 2015-11-28 (×4): 2 via ORAL
  Filled 2015-11-27 (×4): qty 2

## 2015-11-27 MED ORDER — INSULIN ASPART 100 UNIT/ML ~~LOC~~ SOLN
0.0000 [IU] | Freq: Three times a day (TID) | SUBCUTANEOUS | Status: DC
  Administered 2015-11-28: 3 [IU] via SUBCUTANEOUS

## 2015-11-27 MED ORDER — HYDROMORPHONE HCL 1 MG/ML IJ SOLN
0.5000 mg | INTRAMUSCULAR | Status: DC | PRN
Start: 2015-11-27 — End: 2015-11-28

## 2015-11-27 MED ORDER — DEXAMETHASONE SODIUM PHOSPHATE 10 MG/ML IJ SOLN: 10.0000 mg | Freq: Once | INTRAMUSCULAR | Status: DC

## 2015-11-27 MED ORDER — PROPOFOL 10 MG/ML IV BOLUS
INTRAVENOUS | Status: AC
  Filled 2015-11-27: qty 40

## 2015-11-27 MED ORDER — MIDAZOLAM HCL 2 MG/2ML IJ SOLN
INTRAMUSCULAR | Status: AC
  Filled 2015-11-27: qty 2

## 2015-11-27 MED ORDER — DEXAMETHASONE SODIUM PHOSPHATE 10 MG/ML IJ SOLN
10.0000 mg | Freq: Once | INTRAMUSCULAR | Status: AC
  Administered 2015-11-27: 10 mg via INTRAVENOUS

## 2015-11-27 MED ORDER — DIPHENHYDRAMINE HCL 25 MG PO CAPS: 25.0000 mg | ORAL_CAPSULE | Freq: Four times a day (QID) | ORAL | Status: DC | PRN

## 2015-11-27 MED ORDER — SODIUM CHLORIDE 0.9 % IJ SOLN
INTRAMUSCULAR | Status: AC
  Filled 2015-11-27: qty 50

## 2015-11-27 MED ORDER — HYDRALAZINE HCL 20 MG/ML IJ SOLN
INTRAMUSCULAR | Status: DC | PRN
  Administered 2015-11-27: 10 mg via INTRAVENOUS
  Administered 2015-11-27: 5 mg via INTRAVENOUS

## 2015-11-27 MED ORDER — ESMOLOL HCL 100 MG/10ML IV SOLN
INTRAVENOUS | Status: DC | PRN
  Administered 2015-11-27 (×2): 20 mg via INTRAVENOUS

## 2015-11-27 MED ORDER — MENTHOL 3 MG MT LOZG: 1.0000 | LOZENGE | OROMUCOSAL | Status: DC | PRN

## 2015-11-27 MED ORDER — STERILE WATER FOR IRRIGATION IR SOLN
Status: DC | PRN
  Administered 2015-11-27: 3000 mL

## 2015-11-27 MED ORDER — OXYCODONE HCL 5 MG PO TABS
5.0000 mg | ORAL_TABLET | Freq: Once | ORAL | Status: DC | PRN
Start: 2015-11-27 — End: 2015-11-27

## 2015-11-27 MED ORDER — METHOCARBAMOL 500 MG PO TABS
500.0000 mg | ORAL_TABLET | Freq: Four times a day (QID) | ORAL | Status: DC | PRN
  Administered 2015-11-28: 500 mg via ORAL
  Filled 2015-11-27: qty 1

## 2015-11-27 MED ORDER — LIDOCAINE 2% (20 MG/ML) 5 ML SYRINGE
INTRAMUSCULAR | Status: AC
  Filled 2015-11-27: qty 5

## 2015-11-27 MED ORDER — CEFAZOLIN SODIUM-DEXTROSE 2-4 GM/100ML-% IV SOLN
2.0000 g | Freq: Four times a day (QID) | INTRAVENOUS | Status: AC
  Administered 2015-11-27 – 2015-11-28 (×2): 2 g via INTRAVENOUS
  Filled 2015-11-27 (×2): qty 100

## 2015-11-27 MED ORDER — ONDANSETRON HCL 4 MG PO TABS: 4.0000 mg | ORAL_TABLET | Freq: Four times a day (QID) | ORAL | Status: DC | PRN

## 2015-11-27 MED ORDER — SODIUM CHLORIDE 0.9 % IV SOLN
INTRAVENOUS | Status: DC
  Administered 2015-11-27 – 2015-11-28 (×2): via INTRAVENOUS

## 2015-11-27 MED ORDER — OXYCODONE HCL 5 MG/5ML PO SOLN
5.0000 mg | Freq: Once | ORAL | Status: DC | PRN
  Filled 2015-11-27: qty 5

## 2015-11-27 MED ORDER — DEXAMETHASONE SODIUM PHOSPHATE 10 MG/ML IJ SOLN
INTRAMUSCULAR | Status: AC
  Filled 2015-11-27: qty 1

## 2015-11-27 MED ORDER — KETOROLAC TROMETHAMINE 30 MG/ML IJ SOLN
INTRAMUSCULAR | Status: DC | PRN
  Administered 2015-11-27: 30 mg via INTRAVENOUS

## 2015-11-27 MED ORDER — POLYETHYLENE GLYCOL 3350 17 G PO PACK
17.0000 g | PACK | Freq: Two times a day (BID) | ORAL | Status: DC
Start: 2015-11-27 — End: 2015-11-28
  Administered 2015-11-27 – 2015-11-28 (×2): 17 g via ORAL
  Filled 2015-11-27 (×2): qty 1

## 2015-11-27 MED ORDER — METHOCARBAMOL 1000 MG/10ML IJ SOLN
500.0000 mg | Freq: Four times a day (QID) | INTRAVENOUS | Status: DC | PRN
  Administered 2015-11-27: 500 mg via INTRAVENOUS
  Filled 2015-11-27: qty 5
  Filled 2015-11-27: qty 550

## 2015-11-27 MED ORDER — FENTANYL CITRATE (PF) 100 MCG/2ML IJ SOLN
INTRAMUSCULAR | Status: DC | PRN
  Administered 2015-11-27 (×2): 50 ug via INTRAVENOUS

## 2015-11-27 MED ORDER — MIDAZOLAM HCL 5 MG/5ML IJ SOLN
INTRAMUSCULAR | Status: DC | PRN
  Administered 2015-11-27 (×2): 1 mg via INTRAVENOUS

## 2015-11-27 MED ORDER — HYDROMORPHONE HCL 1 MG/ML IJ SOLN
INTRAMUSCULAR | Status: AC
  Administered 2015-11-27: 0.5 mg via INTRAVENOUS
  Filled 2015-11-27: qty 1

## 2015-11-27 MED ORDER — MAGNESIUM CITRATE PO SOLN
1.0000 | Freq: Once | ORAL | Status: DC | PRN
Start: 2015-11-27 — End: 2015-11-28

## 2015-11-27 MED ORDER — SODIUM CHLORIDE 0.9 % IR SOLN
Status: DC | PRN
  Administered 2015-11-27: 1000 mL

## 2015-11-27 MED ORDER — CEFAZOLIN SODIUM-DEXTROSE 2-4 GM/100ML-% IV SOLN
INTRAVENOUS | Status: AC
  Filled 2015-11-27: qty 100

## 2015-11-27 MED ORDER — SODIUM CHLORIDE 0.9 % IV SOLN
1000.0000 mg | INTRAVENOUS | Status: AC
  Administered 2015-11-27: 1000 mg via INTRAVENOUS
  Filled 2015-11-27: qty 1100

## 2015-11-27 MED ORDER — HYDROMORPHONE HCL 1 MG/ML IJ SOLN
0.2500 mg | INTRAMUSCULAR | Status: DC | PRN
  Administered 2015-11-27 (×3): 0.5 mg via INTRAVENOUS

## 2015-11-27 MED ORDER — ASPIRIN 81 MG PO CHEW
81.0000 mg | CHEWABLE_TABLET | Freq: Two times a day (BID) | ORAL | Status: DC
  Administered 2015-11-27 – 2015-11-28 (×2): 81 mg via ORAL
  Filled 2015-11-27 (×2): qty 1

## 2015-11-27 MED ORDER — CELECOXIB 200 MG PO CAPS
200.0000 mg | ORAL_CAPSULE | Freq: Two times a day (BID) | ORAL | Status: DC
Start: 2015-11-27 — End: 2015-11-28
  Administered 2015-11-27 – 2015-11-28 (×2): 200 mg via ORAL
  Filled 2015-11-27 (×2): qty 1

## 2015-11-27 MED ORDER — NICOTINE 21 MG/24HR TD PT24
21.0000 mg | MEDICATED_PATCH | Freq: Every day | TRANSDERMAL | Status: DC
  Administered 2015-11-27: 21 mg via TRANSDERMAL
  Filled 2015-11-27: qty 1

## 2015-11-27 MED ORDER — KETOROLAC TROMETHAMINE 30 MG/ML IJ SOLN
INTRAMUSCULAR | Status: AC
  Filled 2015-11-27: qty 1

## 2015-11-27 MED ORDER — BUPIVACAINE HCL (PF) 0.25 % IJ SOLN
INTRAMUSCULAR | Status: AC
  Filled 2015-11-27: qty 30

## 2015-11-27 MED ORDER — METOCLOPRAMIDE HCL 5 MG/ML IJ SOLN: 5.0000 mg | Freq: Three times a day (TID) | INTRAMUSCULAR | Status: DC | PRN

## 2015-11-27 MED ORDER — FENTANYL CITRATE (PF) 100 MCG/2ML IJ SOLN
INTRAMUSCULAR | Status: AC
  Filled 2015-11-27: qty 2

## 2015-11-27 MED ORDER — ALUM & MAG HYDROXIDE-SIMETH 200-200-20 MG/5ML PO SUSP: 30.0000 mL | ORAL | Status: DC | PRN

## 2015-11-27 SURGICAL SUPPLY — 50 items
ADH SKN CLS APL DERMABOND .7 (GAUZE/BANDAGES/DRESSINGS) ×1
BAG DECANTER FOR FLEXI CONT (MISCELLANEOUS) IMPLANT
BAG SPEC THK2 15X12 ZIP CLS (MISCELLANEOUS)
BAG ZIPLOCK 12X15 (MISCELLANEOUS) IMPLANT
BANDAGE ACE 6X5 VEL STRL LF (GAUZE/BANDAGES/DRESSINGS) ×3 IMPLANT
BLADE SAW SGTL 13.0X1.19X90.0M (BLADE) ×3 IMPLANT
BONE CEMENT GENTAMICIN (Cement) ×6 IMPLANT
BOWL SMART MIX CTS (DISPOSABLE) ×3 IMPLANT
CAP KNEE TOTAL 3 SIGMA ×2 IMPLANT
CEMENT BONE GENTAMICIN 40 (Cement) IMPLANT
CLOTH BEACON ORANGE TIMEOUT ST (SAFETY) ×3 IMPLANT
CUFF TOURN SGL QUICK 34 (TOURNIQUET CUFF) ×3
CUFF TRNQT CYL 34X4X40X1 (TOURNIQUET CUFF) ×1 IMPLANT
DECANTER SPIKE VIAL GLASS SM (MISCELLANEOUS) ×3 IMPLANT
DERMABOND ADVANCED (GAUZE/BANDAGES/DRESSINGS) ×2
DERMABOND ADVANCED .7 DNX12 (GAUZE/BANDAGES/DRESSINGS) ×1 IMPLANT
DRAPE U-SHAPE 47X51 STRL (DRAPES) ×3 IMPLANT
DRESSING AQUACEL AG SP 3.5X10 (GAUZE/BANDAGES/DRESSINGS) ×1 IMPLANT
DRSG AQUACEL AG ADV 3.5X10 (GAUZE/BANDAGES/DRESSINGS) ×2 IMPLANT
DRSG AQUACEL AG SP 3.5X10 (GAUZE/BANDAGES/DRESSINGS) ×3
DURAPREP 26ML APPLICATOR (WOUND CARE) ×6 IMPLANT
ELECT REM PT RETURN 9FT ADLT (ELECTROSURGICAL) ×3
ELECTRODE REM PT RTRN 9FT ADLT (ELECTROSURGICAL) ×1 IMPLANT
GLOVE BIOGEL M 7.0 STRL (GLOVE) IMPLANT
GLOVE BIOGEL PI IND STRL 7.5 (GLOVE) ×1 IMPLANT
GLOVE BIOGEL PI IND STRL 8.5 (GLOVE) ×1 IMPLANT
GLOVE BIOGEL PI INDICATOR 7.5 (GLOVE) ×10
GLOVE BIOGEL PI INDICATOR 8.5 (GLOVE)
GLOVE ECLIPSE 8.0 STRL XLNG CF (GLOVE) ×5 IMPLANT
GLOVE ORTHO TXT STRL SZ7.5 (GLOVE) ×6 IMPLANT
GOWN STRL REUS W/TWL LRG LVL3 (GOWN DISPOSABLE) ×5 IMPLANT
GOWN STRL REUS W/TWL XL LVL3 (GOWN DISPOSABLE) ×5 IMPLANT
HANDPIECE INTERPULSE COAX TIP (DISPOSABLE) ×3
MANIFOLD NEPTUNE II (INSTRUMENTS) ×3 IMPLANT
PACK TOTAL KNEE CUSTOM (KITS) ×3 IMPLANT
POSITIONER SURGICAL ARM (MISCELLANEOUS) ×3 IMPLANT
SET HNDPC FAN SPRY TIP SCT (DISPOSABLE) ×1 IMPLANT
SET PAD KNEE POSITIONER (MISCELLANEOUS) ×3 IMPLANT
SUT MNCRL AB 4-0 PS2 18 (SUTURE) ×3 IMPLANT
SUT VIC AB 1 CT1 36 (SUTURE) ×5 IMPLANT
SUT VIC AB 2-0 CT1 27 (SUTURE) ×9
SUT VIC AB 2-0 CT1 TAPERPNT 27 (SUTURE) ×3 IMPLANT
SUT VLOC 180 0 24IN GS25 (SUTURE) ×3 IMPLANT
SYR 50ML LL SCALE MARK (SYRINGE) ×5 IMPLANT
TRAY FOLEY W/METER SILVER 16FR (SET/KITS/TRAYS/PACK) ×3 IMPLANT
TRAY REVISION SZ 4 (Knees) ×2 IMPLANT
TRAY SLEEVE CEM ML (Knees) ×2 IMPLANT
WATER STERILE IRR 1500ML POUR (IV SOLUTION) ×5 IMPLANT
WRAP KNEE MAXI GEL POST OP (GAUZE/BANDAGES/DRESSINGS) ×3 IMPLANT
YANKAUER SUCT BULB TIP 10FT TU (MISCELLANEOUS) ×3 IMPLANT

## 2015-11-27 NOTE — Interval H&P Note (Signed)
History and Physical Interval Note:  11/27/2015 2:33 PM  [name] [name]  has presented today for surgery, with the diagnosis of POST TRAUMATIC OA OF RIGHT KNEE, STATUS POST HARDWARE REMOVAL  The various methods of treatment have been discussed with the patient and family. After consideration of risks, benefits and other options for treatment, the patient has consented to  Procedure(s): RIGHT TOTAL KNEE ARTHROPLASTY (Right) as a surgical intervention .  The patient's history has been reviewed, patient examined, no change in status, stable for surgery.  I have reviewed the patient's chart and labs.  Questions were answered to the patient's satisfaction.     Shelda PalLIN,Lorieann Argueta D

## 2015-11-27 NOTE — Anesthesia Preprocedure Evaluation (Signed)
Anesthesia Evaluation  Patient identified by MRN, date of birth, ID band Patient awake    Reviewed: Allergy & Precautions, NPO status , Patient's Chart, lab work & pertinent test results  Airway Mallampati: II   Neck ROM: full    Dental   Pulmonary Current Smoker,    breath sounds clear to auscultation       Cardiovascular hypertension,  Rhythm:regular Rate:Normal     Neuro/Psych    GI/Hepatic   Endo/Other  diabetes, Type 2  Renal/GU      Musculoskeletal  (+) Arthritis ,   Abdominal   Peds  Hematology   Anesthesia Other Findings   Reproductive/Obstetrics                             Anesthesia Physical Anesthesia Plan  ASA: II  Anesthesia Plan: MAC and Spinal   Post-op Pain Management:    Induction: Intravenous  Airway Management Planned: Simple Face Mask  Additional Equipment:   Intra-op Plan:   Post-operative Plan:   Informed Consent: I have reviewed the patients History and Physical, chart, labs and discussed the procedure including the risks, benefits and alternatives for the proposed anesthesia with the patient or authorized representative who has indicated his/her understanding and acceptance.     Plan Discussed with: CRNA, Anesthesiologist and Surgeon  Anesthesia Plan Comments:         Anesthesia Quick Evaluation

## 2015-11-27 NOTE — Anesthesia Procedure Notes (Signed)
Spinal  Patient location during procedure: OR Start time: 11/27/2015 3:40 PM End time: 11/27/2015 3:45 PM Staffing Resident/CRNA: Durward Parcel A Performed: resident/CRNA  Preanesthetic Checklist Completed: patient identified, site marked, surgical consent, pre-op evaluation, timeout performed, IV checked, risks and benefits discussed and monitors and equipment checked Spinal Block Patient position: sitting Prep: Betadine Patient monitoring: heart rate, cardiac monitor, continuous pulse ox and blood pressure Approach: midline Location: L3-4 Injection technique: single-shot Needle Needle type: Sprotte  Needle gauge: 24 G Needle length: 9 cm Needle insertion depth: 6 cm Assessment Sensory level: T10

## 2015-11-27 NOTE — Transfer of Care (Signed)
Immediate Anesthesia Transfer of Care Note  Patient: [name] [name]  Procedure(s) Performed: Procedure(s): RIGHT TOTAL KNEE ARTHROPLASTY (Right)  Patient Location: PACU  Anesthesia Type:Spinal  Level of Consciousness: awake, alert , oriented and patient cooperative  Airway & Oxygen Therapy: Patient Spontanous Breathing and Patient connected to face mask oxygen  Post-op Assessment: Report given to RN and Post -op Vital signs reviewed and stable  Post vital signs: Reviewed and stable  Last Vitals:  Vitals:   11/27/15 1202  BP: (!) 151/82  Pulse: 88  Resp: 18  Temp: 37.3 C    Last Pain:  Vitals:   11/27/15 1202  TempSrc: Oral      Patients Stated Pain Goal: 4 (11/27/15 1416)  Complications: No apparent anesthesia complications

## 2015-11-27 NOTE — Op Note (Signed)
NAME:  [name] [name]                      MEDICAL RECORD NO.:  [id number]                             FACILITY:  [facility]      PHYSICIAN:  Madlyn Frankel. Charlann Boxer, M.D.  DATE OF BIRTH:  1960/07/01      DATE OF PROCEDURE:  11/27/2015                                     OPERATIVE REPORT         PREOPERATIVE DIAGNOSIS:  Right post traumatic knee osteoarthritis.      POSTOPERATIVE DIAGNOSIS:  Right post traumatic knee osteoarthritis.      FINDINGS:  The patient was noted to have complete loss of cartilage and   bone-on-bone arthritis with associated osteophytes in all three compartments of   the knee with a significant synovitis and associated effusion.      PROCEDURE:  Right total knee replacement.      COMPONENTS USED:  [employer] rotating platform posterior stabilized knee   system, a size 5 femur, 4 MBT revision tibial tray with a 29 mm cemented sleeve, 10 mm PS insert, and 41 patellar   button.      SURGEON:  Madlyn Frankel. Charlann Boxer, M.D.      ASSISTANT:  Leilani Able, PA-C.      ANESTHESIA:  Spinal.      SPECIMENS:  None.      COMPLICATION:  None.      DRAINS:  None.  EBL: <200cc      TOURNIQUET TIME:   Total Tourniquet Time Documented: Thigh (Right) - 61 minutes Total: Thigh (Right) - 61 minutes  .      The patient was stable to the recovery room.      INDICATION FOR PROCEDURE:  [name] [name] is a 55 y.o. male patient of   mine.  The patient had been seen, evaluated, and treated conservatively in the   office with medication, activity modification, and injections.  The patient had a history of significant right bicondylar tibial plateau fracture and distal femur fracture.  His fractures went on to unite clinically and radiographically however he developed significant post traumatic osteoarthritis of his knee with radiographic changes of bone-on-bone arthritis with endplate sclerosis and osteophytes noted as well as deformity to the proximal tibia worse laterally.      The patient  failed conservative measures including medication, injections, and activity modification, and at this point was ready for more definitive measures.   Based on the radiographic changes and failed conservative measures, the patient   decided to proceed with total knee replacement.  Risks of infection,   DVT, component failure, need for revision surgery, postop course, and   expectations were all   discussed and reviewed.  Consent was obtained for benefit of pain   relief.   Five weeks ago I had taken [name] [name] to the OR to remove hardware from his distal femur and proximal tibia.  This was done without complication and his skin incisions healed without issue     PROCEDURE IN DETAIL:  The patient was brought to the operative theater.   Once adequate anesthesia, preoperative antibiotics, 2 gm of Ancef, 1 gm of Tranexamic Acid,  and 10 mg of Decadron administered, the patient was positioned supine with the right thigh tourniquet placed.  The  right lower extremity was prepped and draped in sterile fashion.  A time-   out was performed identifying the patient, planned procedure, and   extremity.      The right lower extremity was placed in the [facility] leg holder.  The leg was   exsanguinated, tourniquet elevated to 250 mmHg.  A midline incision was   made followed by median parapatellar arthrotomy.  Following initial   exposure, attention was first directed to the patella.  Precut   measurement was noted to be 25 mm.  I resected down to 14 mm and used a   41 patellar button to restore patellar height as well as cover the cut   surface.      The lug holes were drilled and a metal shim was placed to protect the   patella from retractors and saw blades.      At this point, attention was now directed to the femur.  The femoral   canal was opened with a drill, irrigated to try to prevent fat emboli.  An   intramedullary rod was passed at 5 degrees valgus, 10 mm of bone was   resected off the distal  femur.  Following this resection, the tibia was   subluxated anteriorly.  Using the extramedullary guide, 2 mm of bone was resected off   the proximal lateral tibia.  We confirmed the gap would be   stable medially and laterally with a 10 mm insert as well as confirmed   the cut was perpendicular in the coronal plane, checking with an alignment rod.      Once this was done, I sized the femur to be a size 5 in the anterior-   posterior dimension, chose a standard component based on medial and   lateral dimension.  The size 5 rotation block was then pinned in   position anterior referenced using the C-clamp to set rotation.  The   anterior, posterior, and  chamfer cuts were made without difficulty nor   notching making certain that I was along the anterior cortex to help   with flexion gap stability.      The final box cut was made off the lateral aspect of distal femur.      At this point, the tibia was sized to be a size 4, the size 4 tray was   then pinned in position through the medial third of the tubercle,   drilled for the MBT tray stem.  I then broached for a 29 mm cemented broach and then keel punched the trial component in to place.  Trial reduction was now carried with a 5 femur,  4 MBT tibial tray with the 29 sleeve, a 10 mm PS insert, and the 41 patella botton.  The knee was brought to   extension, full extension with good flexion stability with the patella   tracking through the trochlea without application of pressure.  Given   all these findings, the trial components removed.  Final components were   opened and cement was mixed.  The knee was irrigated with normal saline   solution and pulse lavage.  The synovial lining was   then injected with 30 cc 0.25% Marcaine with epinephrine and 1 cc of Toradol plus 30 cc of NS for a   total of 61 cc.      The knee was  irrigated.  Final implants were then cemented onto clean and   dried cut surfaces of bone with the knee brought to  extension with a 10 mm trial insert.      Once the cement had fully cured, the excess cement was removed   throughout the knee.  I confirmed I was satisfied with the range of   motion and stability, and the final 10 mm PS insert was chosen.  It was   placed into the knee.      The tourniquet had been let down at 61 minutes.  No significant   hemostasis required.  The   extensor mechanism was then reapproximated using #1 Vicryl and #0 V-lock sutures with the knee   in flexion.  The   remaining wound was closed with 2-0 Vicryl and running 4-0 Monocryl.   The knee was cleaned, dried, dressed sterilely using Dermabond and   Aquacel dressing.  The patient was then   brought to recovery room in stable condition, tolerating the procedure   well.   Please note that Physician Assistant, Leilani AbleSteve Chabon, PA-C, was present for the entirety of the case, and was utilized for pre-operative positioning, peri-operative retractor management, general facilitation of the procedure.  He was also utilized for primary wound closure at the end of the case.              Madlyn FrankelMatthew D. Charlann Boxerlin, M.D.    11/27/2015 5:38 PM

## 2015-11-28 ENCOUNTER — Encounter (HOSPITAL_COMMUNITY): Payer: Self-pay | Admitting: Orthopedic Surgery

## 2015-11-28 LAB — CBC
HCT: 38.9 % — ABNORMAL LOW (ref 39.0–52.0)
Hemoglobin: 13 g/dL (ref 13.0–17.0)
MCH: 32.5 pg (ref 26.0–34.0)
MCHC: 33.4 g/dL (ref 30.0–36.0)
MCV: 97.3 fL (ref 78.0–100.0)
PLATELETS: 268 10*3/uL (ref 150–400)
RBC: 4 MIL/uL — AB (ref 4.22–5.81)
RDW: 12.9 % (ref 11.5–15.5)
WBC: 8.7 10*3/uL (ref 4.0–10.5)

## 2015-11-28 LAB — BASIC METABOLIC PANEL
Anion gap: 9 (ref 5–15)
BUN: 21 mg/dL — AB (ref 6–20)
CO2: 26 mmol/L (ref 22–32)
Calcium: 9 mg/dL (ref 8.9–10.3)
Chloride: 102 mmol/L (ref 101–111)
Creatinine, Ser: 0.94 mg/dL (ref 0.61–1.24)
GFR calc Af Amer: >60 mL/min (ref >60)
GLUCOSE: 170 mg/dL — AB (ref 65–99)
POTASSIUM: 5 mmol/L (ref 3.5–5.1)
Sodium: 137 mmol/L (ref 135–145)

## 2015-11-28 LAB — GLUCOSE, CAPILLARY: Glucose-Capillary: 159 mg/dL — ABNORMAL HIGH (ref 65–99)

## 2015-11-28 MED ORDER — FERROUS SULFATE 325 (65 FE) MG PO TABS: 325.0000 mg | ORAL_TABLET | Freq: Three times a day (TID) | ORAL | 3 refills | Status: AC

## 2015-11-28 MED ORDER — POLYETHYLENE GLYCOL 3350 17 G PO PACK: 17.0000 g | PACK | Freq: Two times a day (BID) | ORAL | 0 refills | Status: AC

## 2015-11-28 MED ORDER — DOCUSATE SODIUM 100 MG PO CAPS: 100.0000 mg | ORAL_CAPSULE | Freq: Two times a day (BID) | ORAL | 0 refills | Status: AC

## 2015-11-28 MED ORDER — METHOCARBAMOL 500 MG PO TABS: 500.0000 mg | ORAL_TABLET | Freq: Four times a day (QID) | ORAL | 0 refills | Status: AC | PRN

## 2015-11-28 MED ORDER — ASPIRIN 81 MG PO CHEW: 81.0000 mg | CHEWABLE_TABLET | Freq: Two times a day (BID) | ORAL | 0 refills | Status: AC

## 2015-11-28 MED ORDER — HYDROCODONE-ACETAMINOPHEN 7.5-325 MG PO TABS: 1.0000 | ORAL_TABLET | ORAL | 0 refills | Status: AC | PRN

## 2015-11-28 NOTE — Evaluation (Signed)
Physical Therapy Evaluation Patient Details Name: [name] [name] MRN: [id number] DOB: [birth year] Today's Date: 11/28/2015   History of Present Illness  55 yo male s/p R TKA 11/27/15. Hx of DM, obesity  Clinical Impression  On eval, pt was supervision level assist for mobility. He walked ~100 feet with a RW. Moderate pain with activity but pt participated well. Practiced/reviewed exercises and gait training. Pt declined to practice stair negotiation. Issued HEP for pt to perform at least 2x/day. Encouraged pt to use walker for ~2 weeks or until pain is well controlled/gait pattern normalized. All education completed. No further questions from pt. Ready to d/c from PT standpoint.     Follow Up Recommendations No PT follow up (pt declines PT follow up)    Equipment Recommendations  None recommended by PT    Recommendations for Other Services       Precautions / Restrictions Restrictions Weight Bearing Restrictions: No RLE Weight Bearing: Weight bearing as tolerated      Mobility  Bed Mobility Overal bed mobility: Needs Assistance Bed Mobility: Supine to Sit     Supine to sit: Supervision     General bed mobility comments: for safety  Transfers Overall transfer level: Needs assistance Equipment used: Rolling walker (2 wheeled) Transfers: Sit to/from Stand Sit to Stand: Supervision         General transfer comment: for safety  Ambulation/Gait Ambulation/Gait assistance: Supervision Ambulation Distance (Feet): 100 Feet Assistive device: Rolling walker (2 wheeled) Gait Pattern/deviations: Step-to pattern;Antalgic     General Gait Details: for safety  Stairs Stairs:  (pt declined to practice stair negotiation)          Wheelchair Mobility    Modified Rankin (Stroke Patients Only)       Balance                                             Pertinent Vitals/Pain Pain Assessment: 0-10 Pain Score: 7  Pain Location: R knee Pain  Intervention(s): Ice applied;Monitored during session    Home Living Family/patient expects to be discharged to:: Private residence Living Arrangements: Spouse/significant other Available Help at Discharge: Friend(s) Type of Home: House Home Access: Stairs to enter Entrance Stairs-Rails: Right Entrance Stairs-Number of Steps: 3 Home Layout: One level Home Equipment: Environmental consultant - 2 wheels;Crutches;Cane - single point      Prior Function Level of Independence: Independent               Hand Dominance        Extremity/Trunk Assessment   Upper Extremity Assessment: Overall WFL for tasks assessed           Lower Extremity Assessment: Generalized weakness      Cervical / Trunk Assessment: Normal  Communication   Communication: No difficulties  Cognition Arousal/Alertness: Awake/alert Behavior During Therapy: WFL for tasks assessed/performed Overall Cognitive Status: Within Functional Limits for tasks assessed                      General Comments      Exercises Total Joint Exercises Ankle Circles/Pumps: AROM;Both;10 reps;Supine Quad Sets: AROM;Both;10 reps;Supine Hip ABduction/ADduction: AROM;Right;10 reps;Supine Straight Leg Raises: AROM;Right;10 reps;Supine Knee Flexion: AAROM;Right;10 reps;Seated Goniometric ROM: ~5-80 degrees   Assessment/Plan    PT Assessment Patient needs continued PT services  PT Problem List Decreased strength;Decreased mobility;Decreased range of motion;Decreased activity tolerance;Decreased balance;Decreased knowledge of  use of DME;Pain          PT Treatment Interventions DME instruction;Therapeutic activities;Therapeutic exercise;Gait training;Stair training;Functional mobility training;Patient/family education    PT Goals (Current goals can be found in the Care Plan section)  Acute Rehab PT Goals Patient Stated Goal: to regain PLOF PT Goal Formulation: All assessment and education complete, DC therapy    Frequency  7X/week   Barriers to discharge        Co-evaluation               End of Session Equipment Utilized During Treatment: Gait belt Activity Tolerance: Patient tolerated treatment well Patient left: in chair;with call bell/phone within reach           Time: 0912-0940 PT Time Calculation (min) (ACUTE ONLY): 28 min   Charges:   PT Evaluation $PT Eval Low Complexity: 1 Procedure PT Treatments $Gait Training: 8-22 mins   PT G Codes:        Rebeca AlertJannie Reagan Behlke, MPT Pager: [phone]

## 2015-11-28 NOTE — Progress Notes (Signed)
Pt to d/c home. No home health required/needed. Refused any DME. AVS reviewed and "My Chart" discussed with pt. Pt capable of verbalizing medications, signs and symptoms of infection, and follow-up appointments. Remains hemodynamically stable. No signs and symptoms of distress. Educated pt to return to ER in the case of SOB, dizziness, or chest pain.

## 2015-11-28 NOTE — Discharge Instructions (Signed)

## 2015-11-28 NOTE — Progress Notes (Signed)
OT Cancellation Note  Patient Details Name: Marthenia RollingCharles Ploeger MRN: [id number] DOB: Jan 31, 1961   Cancelled Treatment:    Reason Eval/Treat Not Completed: OT screened, no needs identified, will sign off  Galen ManilaSpencer, Achaia Garlock Jeanette 11/28/2015, 9:55 AM

## 2015-11-28 NOTE — Progress Notes (Signed)
     Subjective: 1 Day Post-Op Procedure(s) (LRB): RIGHT TOTAL KNEE ARTHROPLASTY (Right)   Seen by Dr. Charlann Boxerlin. Patient reports pain as mild, pain controlled. No events throughout the night. Wants to go home without HHPT and work on his own, Dr. Charlann Boxerlin reviewed with the patient what he needs to do.  Ready to be discharged home.  Objective:   VITALS:   Vitals:   11/28/15 0054 11/28/15 0542  BP: 121/77 (!) 121/57  Pulse: 70 72  Resp: 12 16  Temp: 98.2 F (36.8 C) 98.4 F (36.9 C)    Dorsiflexion/Plantar flexion intact Incision: dressing C/D/I No cellulitis present Compartment soft  LABS  Recent Labs  11/28/15 0401  HGB 13.0  HCT 38.9*  WBC 8.7  PLT 268     Recent Labs  11/28/15 0401  NA 137  K 5.0  BUN 21*  CREATININE 0.94  GLUCOSE 170*     Assessment/Plan: 1 Day Post-Op Procedure(s) (LRB): RIGHT TOTAL KNEE ARTHROPLASTY (Right) Foley cath d/c'ed Advance diet Up with therapy D/C IV fluids Discharge home [facility]. Follow up with OLIN,Fransheska Willingham D in 2 weeks.  Contact information:  [facility] [address], [address] [place] [id number] [phone]    Obese (BMI 30-39.9) Estimated body mass index is 37.21 kg/m as calculated from the following:   Height as of this encounter: 5\' 9"  (1.753 m).   Weight as of this encounter: 114.3 kg (252 lb). Patient also counseled that weight may inhibit the healing process Patient counseled that losing weight will help with future health issues        Anastasio AuerbachMatthew S. Shironda Kain   PAC  11/28/2015, 7:41 AM

## 2015-11-28 NOTE — Care Management Note (Signed)
Case Management Note  Patient Details  Name: Marthenia RollingCharles Brisk MRN: [id number] Date of Birth: [birth year]  Subjective/Objective:  55 y.o. M admitted 11/27/2015 R TKA. Denies the need for HHPT as he tells me he has had "7 surgeries with HHPT afterwards" and is well aware of the exercises he needs to do in order to rehab in the weeks ahead. [facility] and follow a Rehab Plan there that he has developed prior to surgery. CM made self available should he change his mind.                   Action/Plan:Discharge   Expected Discharge Date:                  Expected Discharge Plan:  Home/Self Care  In-House Referral:  NA  Discharge planning Services  CM Consult  Post Acute Care Choice:  Durable Medical Equipment, Home Health Choice offered to:  Patient  DME Arranged:  N/A (pt refused HHPT/3n1) DME Agency:     HH Arranged:  NA HH Agency:  NA  Status of Service:  Completed, signed off  If discussed at Long Length of Stay Meetings, dates discussed:    Additional Comments:  Yvone NeuCrutchfield, Braylynn Ghan M, RN 11/28/2015, 10:09 AM

## 2015-11-30 ENCOUNTER — Encounter (HOSPITAL_COMMUNITY): Payer: Self-pay | Admitting: Orthopedic Surgery

## 2015-11-30 NOTE — Anesthesia Postprocedure Evaluation (Signed)
Anesthesia Post Note  Patient: [name] RollingCharles [name]  Procedure(s) Performed: Procedure(s) (LRB): RIGHT TOTAL KNEE ARTHROPLASTY (Right)  Patient location during evaluation: PACU Anesthesia Type: Spinal Level of consciousness: oriented and awake and alert Pain management: pain level controlled Vital Signs Assessment: post-procedure vital signs reviewed and stable Respiratory status: spontaneous breathing, respiratory function stable and patient connected to nasal cannula oxygen Cardiovascular status: blood pressure returned to baseline and stable Postop Assessment: no headache and no backache Anesthetic complications: no    Last Vitals:  Vitals:   11/28/15 0542 11/28/15 1009  BP: (!) 121/57 134/74  Pulse: 72 89  Resp: 16 16  Temp: 36.9 C 36.9 C    Last Pain:  Vitals:   11/28/15 1100  TempSrc:   PainSc: 3                  Emrey Thornley S

## 2015-12-04 NOTE — Discharge Summary (Signed)
Physician Discharge Summary  Patient ID: [name] [name] MRN: [id number] DOB/AGE: 1960-11-05 55 y.o.  Admit date: 11/27/2015 Discharge date: 11/28/2015   Procedures:  Procedure(s) (LRB): RIGHT TOTAL KNEE ARTHROPLASTY (Right)  Attending Physician:  Dr. Durene RomansMatthew Olin   Admission Diagnoses:    Right knee traumatic OA / pain  Discharge Diagnoses:  Principal Problem:   S/P right TKA Active Problems:   S/P knee replacement  Past Medical History:  Diagnosis Date  . Arthritis   . Diabetes (HCC)   . Hypercholesterolemia   . Hypertension   . Metabolic syndrome   . Nicotine dependence 04/03/2012  . Obesity, unspecified 04/03/2012  . 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 Unspecified vitamin D deficiency 04/03/2012    HPI:    [name] [name], 55 y.o. male, has a history of pain and functional disability in the right knee due to trauma and arthritis and has failed non-surgical conservative treatments for greater than 12 weeks to include NSAID's and/or analgesics, corticosteriod injections, viscosupplementation injections, supervised PT with diminished ADL's post treatment, use of assistive devices and activity modification.  Onset of symptoms was gradual, starting 3+ years ago with gradually worsening course since that time. The patient noted prior procedures on the knee to include  ORIF and removal of hardware on the right knee(s).  Patient currently rates pain in the right knee(s) at 5 out of 10 with activity, more complaints of immobility. Patient has worsening of pain with activity and weight bearing, pain that interferes with activities of daily living, pain with passive range of motion, crepitus and joint swelling.  Patient has evidence of periarticular osteophytes and joint space narrowing by imaging studies. There is no active infection.   Risks, benefits and expectations were discussed with the patient.  Risks including but not limited to the risk of anesthesia, blood clots, nerve damage,  blood vessel damage, failure of the prosthesis, infection and up to and including death.  Patient understand the risks, benefits and expectations and wishes to proceed with surgery.   PCP: Aura DialsBOUSKA,DAVID E, MD   Discharged Condition: good  Hospital Course:  Patient underwent the above stated procedure on 11/27/2015. Patient tolerated the procedure well and brought to the recovery room in good condition and subsequently to the floor.  POD #1 BP: 121/57 ; Pulse: 72 ; Temp: 98.4 F (36.9 C) ; Resp: 16 Patient reports pain as mild, pain controlled. No events throughout the night. Wants to go home without HHPT and work on his own, Dr. Charlann Boxerlin reviewed with the patient what he needs to do.  Ready to be discharged home.  LABS  Basename    HGB     13.0  HCT     38.9    Discharge Exam: General appearance: alert, cooperative and no distress Extremities: Homans sign is negative, no sign of DVT, no edema, redness or tenderness in the calves or thighs and no ulcers, gangrene or trophic changes  Disposition: Home with follow up in 2 weeks   Follow-up Information    Shelda PalLIN,Leonila Speranza D, MD. Schedule an appointment as soon as possible for a visit in 2 week(s).   Specialty:  Orthopedic Surgery Contact information: [address] [place] [id number] [phone]           Discharge Instructions    Call MD / Call 911    Complete by:  As directed    If you experience chest pain or shortness of breath, CALL 911 and be transported to the hospital emergency  room.  If you develope a fever above 101 F, pus (white drainage) or increased drainage or redness at the wound, or calf pain, call your surgeon's office.   Change dressing    Complete by:  As directed    Maintain surgical dressing until follow up in the clinic. If the edges start to pull up, may reinforce with tape. If the dressing is no longer working, may remove and cover with gauze and tape, but must keep the area dry and clean.   Call with any questions or concerns.   Constipation Prevention    Complete by:  As directed    Drink plenty of fluids.  Prune juice may be helpful.  You may use a stool softener, such as Colace (over the counter) 100 mg twice a day.  Use MiraLax (over the counter) for constipation as needed.   Diet - low sodium heart healthy    Complete by:  As directed    Discharge instructions    Complete by:  As directed    Maintain surgical dressing until follow up in the clinic. If the edges start to pull up, may reinforce with tape. If the dressing is no longer working, may remove and cover with gauze and tape, but must keep the area dry and clean.  [facility]. Call with any questions or concerns.   Increase activity slowly as tolerated    Complete by:  As directed    Weight bearing as tolerated with assist device (walker, cane, etc) as directed, use it as long as suggested by your surgeon or therapist, typically at least 4-6 weeks.   TED hose    Complete by:  As directed    Use stockings (TED hose) for 2 weeks on both leg(s).  You may remove them at night for sleeping.        Medication List    STOP taking these medications   aspirin 325 MG EC tablet Replaced by:  aspirin 81 MG chewable tablet     TAKE these medications   aspirin 81 MG chewable tablet Chew 1 tablet (81 mg total) by mouth 2 (two) times daily. Take for 4 weeks, then resume regular dose. Replaces:  aspirin 325 MG EC tablet   CALCIUM PO Take 1 tablet by mouth daily.   docusate sodium 100 MG capsule Commonly known as:  COLACE Take 1 capsule (100 mg total) by mouth 2 (two) times daily.   ferrous sulfate 325 (65 FE) MG tablet Take 1 tablet (325 mg total) by mouth 3 (three) times daily after meals.   FISH OIL PO Take 1 tablet by mouth daily.   HYDROcodone-acetaminophen 7.5-325 MG tablet Commonly known as:  NORCO Take 1-2 tablets by mouth every 4 (four) hours as needed for moderate  pain. What changed:  reasons to take this   lisinopril 10 MG tablet Commonly known as:  PRINIVIL,ZESTRIL Take 1 tablet (10 mg total) by mouth daily.   metFORMIN 500 MG tablet Commonly known as:  GLUCOPHAGE Take 1 tablet (500 mg total) by mouth daily with breakfast.   methocarbamol 500 MG tablet Commonly known as:  ROBAXIN Take 1 tablet (500 mg total) by mouth every 6 (six) hours as needed for muscle spasms.   MULTIVITAMIN PO Take 1 tablet by mouth daily.   polyethylene glycol packet Commonly known as:  MIRALAX / GLYCOLAX Take 17 g by mouth 2 (two) times daily. What changed:  when to take this  reasons  to take this   pravastatin 40 MG tablet Commonly known as:  PRAVACHOL Take 40 mg by mouth daily.        Signed: Anastasio Auerbach. Xyon Lukasik   PA-C  12/04/2015, 8:09 AM

## 2016-05-22 IMAGING — CT CT KNEE*R* W/O CM
2 of 5 series · 7 of 33 positions shown, 8 images · non-contrast
Comparison: 04/02/2012.

CLINICAL DATA: Pain and swelling of the knee. Distal femur
fracture. Tibial plateau fracture.

EXAM:
CT OF THE RIGHT KNEE WITHOUT CONTRAST
TECHNIQUE: Multidetector CT imaging of the RIGHT knee was performed according
to the standard protocol. Multiplanar CT image reconstructions were
also generated.

[Series 207: o-mar, soft tissue · axial · 0.31mm/px · z∈[+200,+340]mm · 4 of 106 slices shown, 5 images]
[im 18/106  soft-tissue]
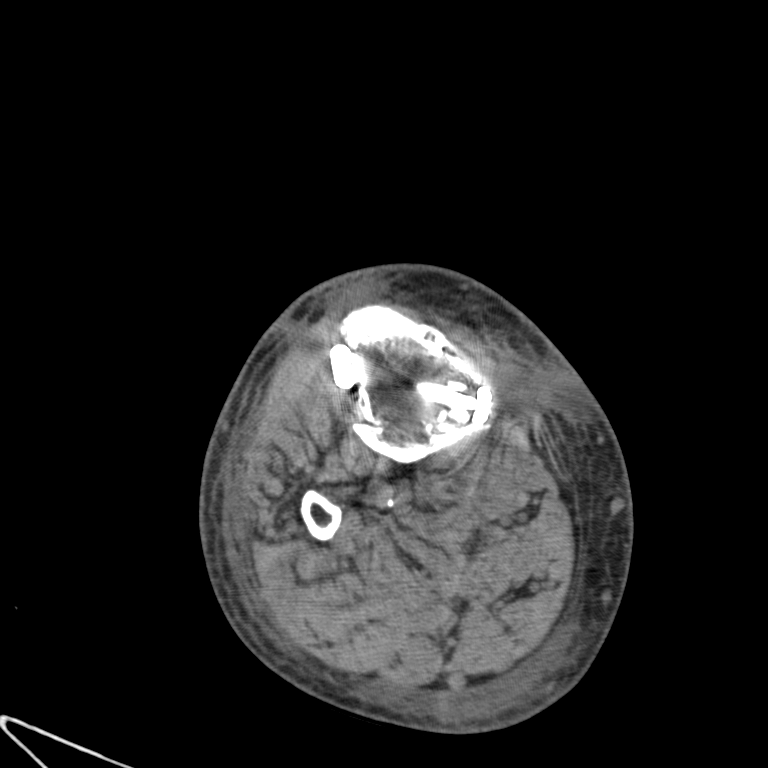
[im 18/106  bone]
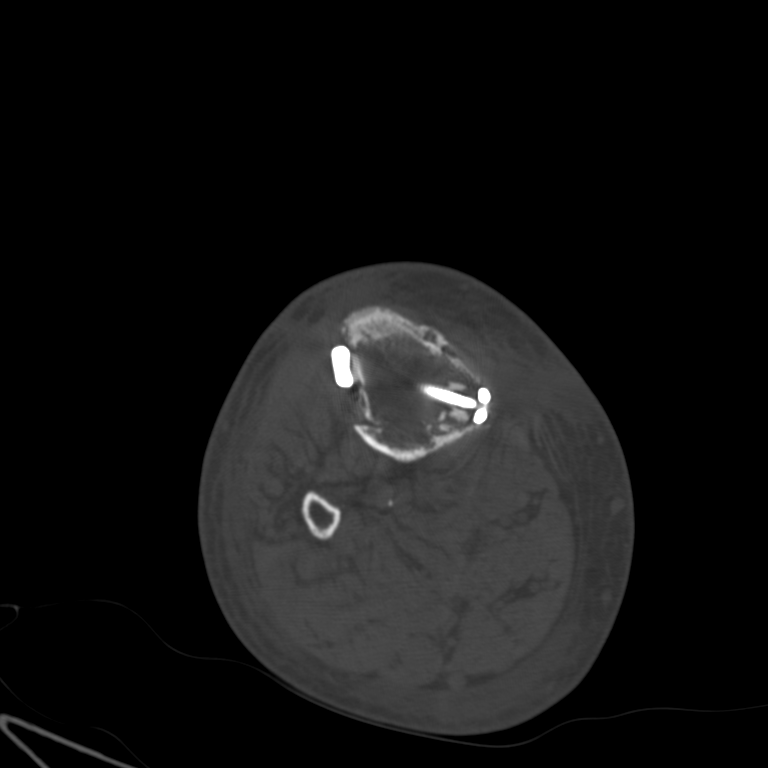
[im 36/106  bone]
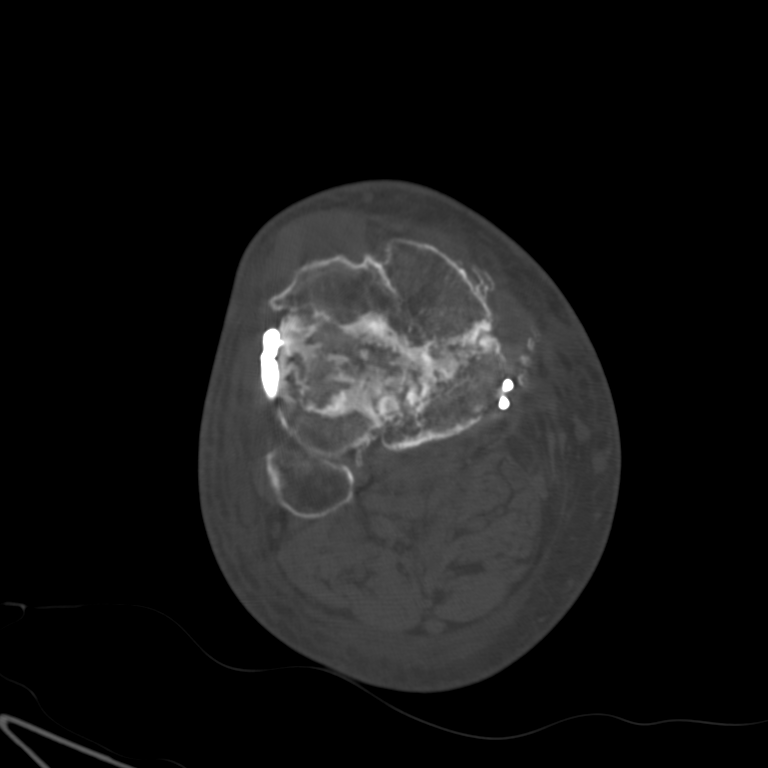
[im 71/106  bone]
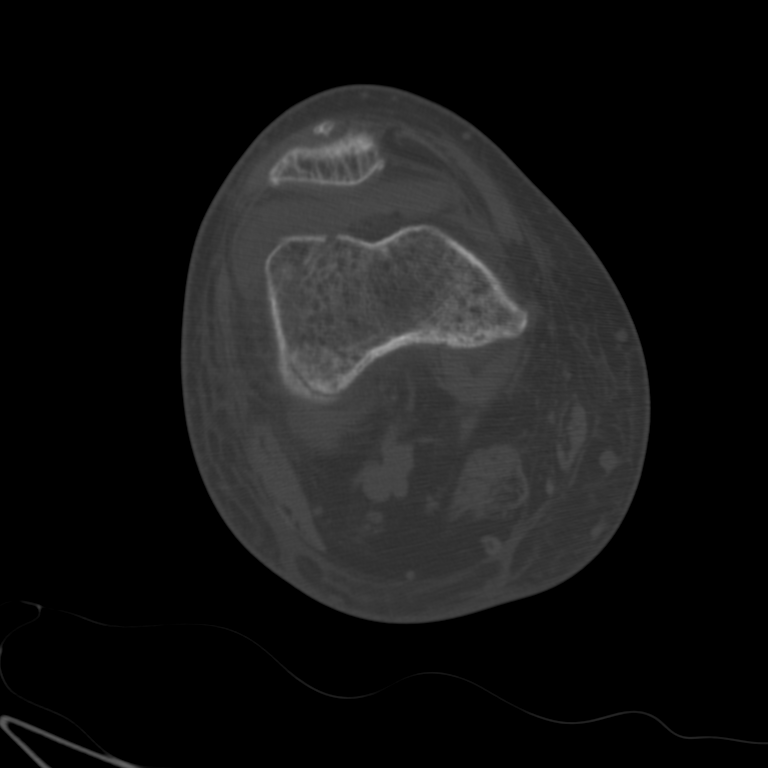
[im 88/106  bone]
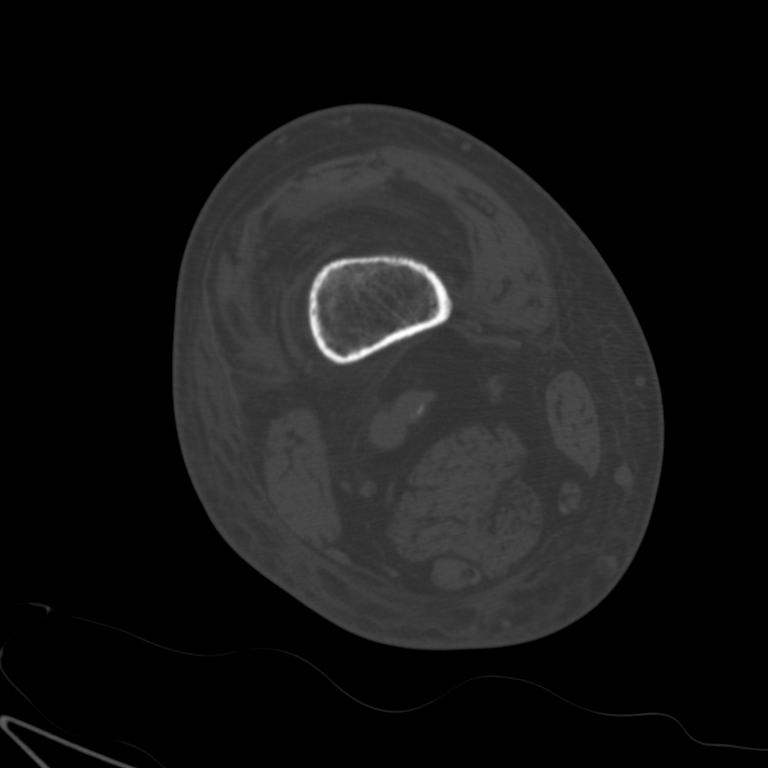

[Series 213: st coronal · coronal · 0.31mm/px · 3 of 73 slices shown]
[im 15/73  bone]
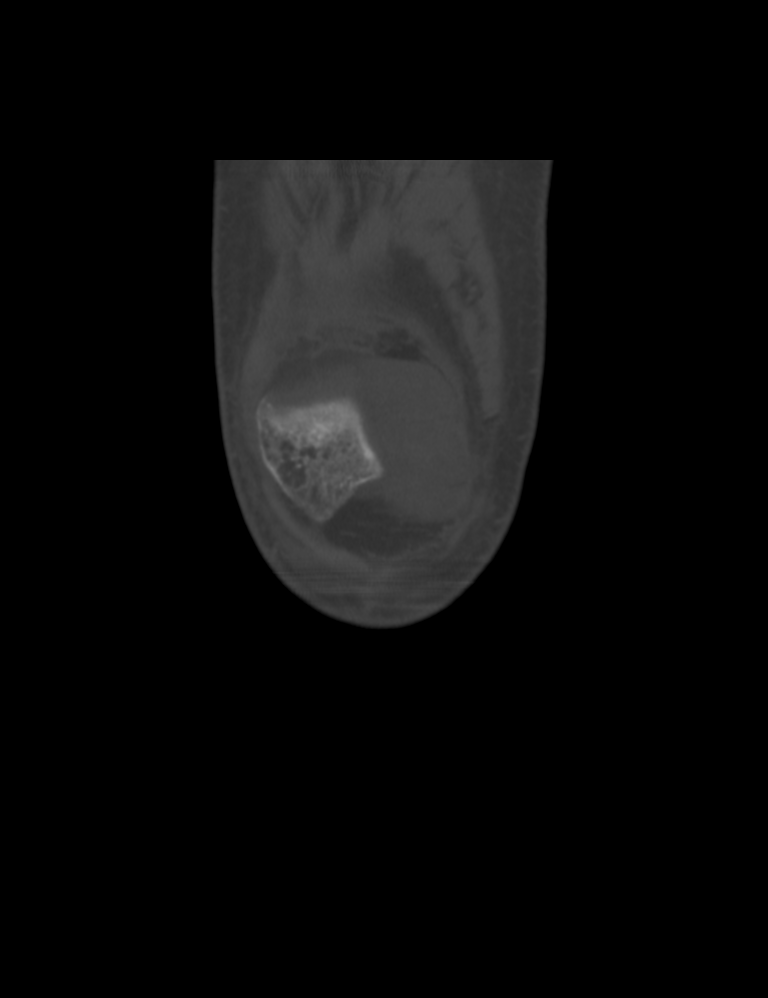
[im 29/73  bone]
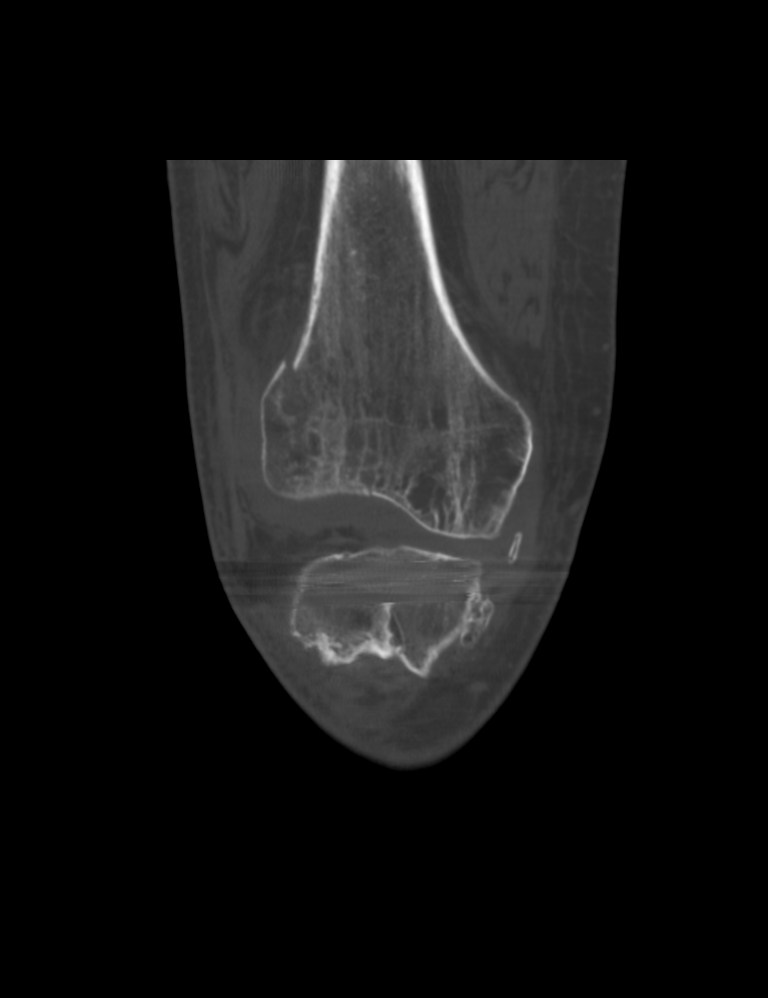
[im 44/73  bone]
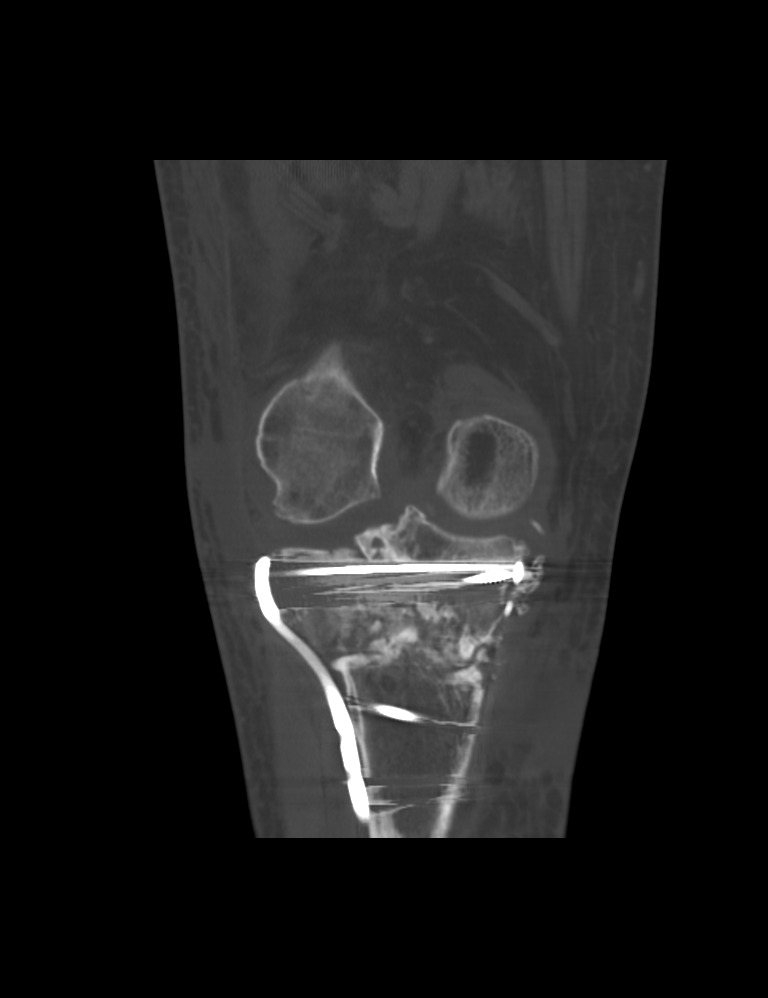

[7 of 33 positions shown; findings below may reference images not displayed]

FINDINGS: Diffuse osteopenia is present. Hemarthrosis of the knee is present
associated with an acute obliquely oriented medial distal femur
fracture. This extends from the anterior medial metaphysis through
the medial femoral condyle and into the medial margin of the
intercondylar notch. Fracture is minimally displaced. Maximal
displacement is 2 mm. Fracture extends into the weight-bearing
surface of the medial femoral condyle (image 37 series [id number]).

The lateral femoral condyle appears intact. Multiple loose bodies
are present in the joint with the largest in the anterior medial
compartment measuring 2 cm transverse, 1 cm AP and 7 mm
craniocaudal. Calcification is present posterior to the medial
compartment, probably extra-articular an representing heterotopic
bone. Tibial plateau fracture with medial buttress plate and lateral
buttress plate and screw fixation. No recurrent tibial fracture. The
tibial fracture is healed. There is persisting cortical irregularity
of the medial femoral condyle weight-bearing surface and to a lesser
extent the lateral femoral condyle. Central osteophytes are present
in the medial PA and lateral weight-bearing femoral condyles.
Moderate patellofemoral osteoarthritis. Diffuse edema is present
around the knee. Scarring associated with buttress plate and screw
fixation.
IMPRESSION: 1. Acute oblique nondisplaced fracture of the distal femur with
intra-articular extension into the medial femoral condyle
weight-bearing surface.
2. Healed comminuted tibial plateau fracture with medial and lateral
buttress plate and screw fixation.

## 2016-05-23 IMAGING — CR DG KNEE 1-2V PORT*R*
2 series · 2 of 2 positions shown · non-contrast
Comparison: CT right knee region August 11, 2013

CLINICAL DATA: Open reduction internal fixation distal femur
fracture

EXAM:
PORTABLE RIGHT KNEE - 1-2 VIEW

[xtable lateral]
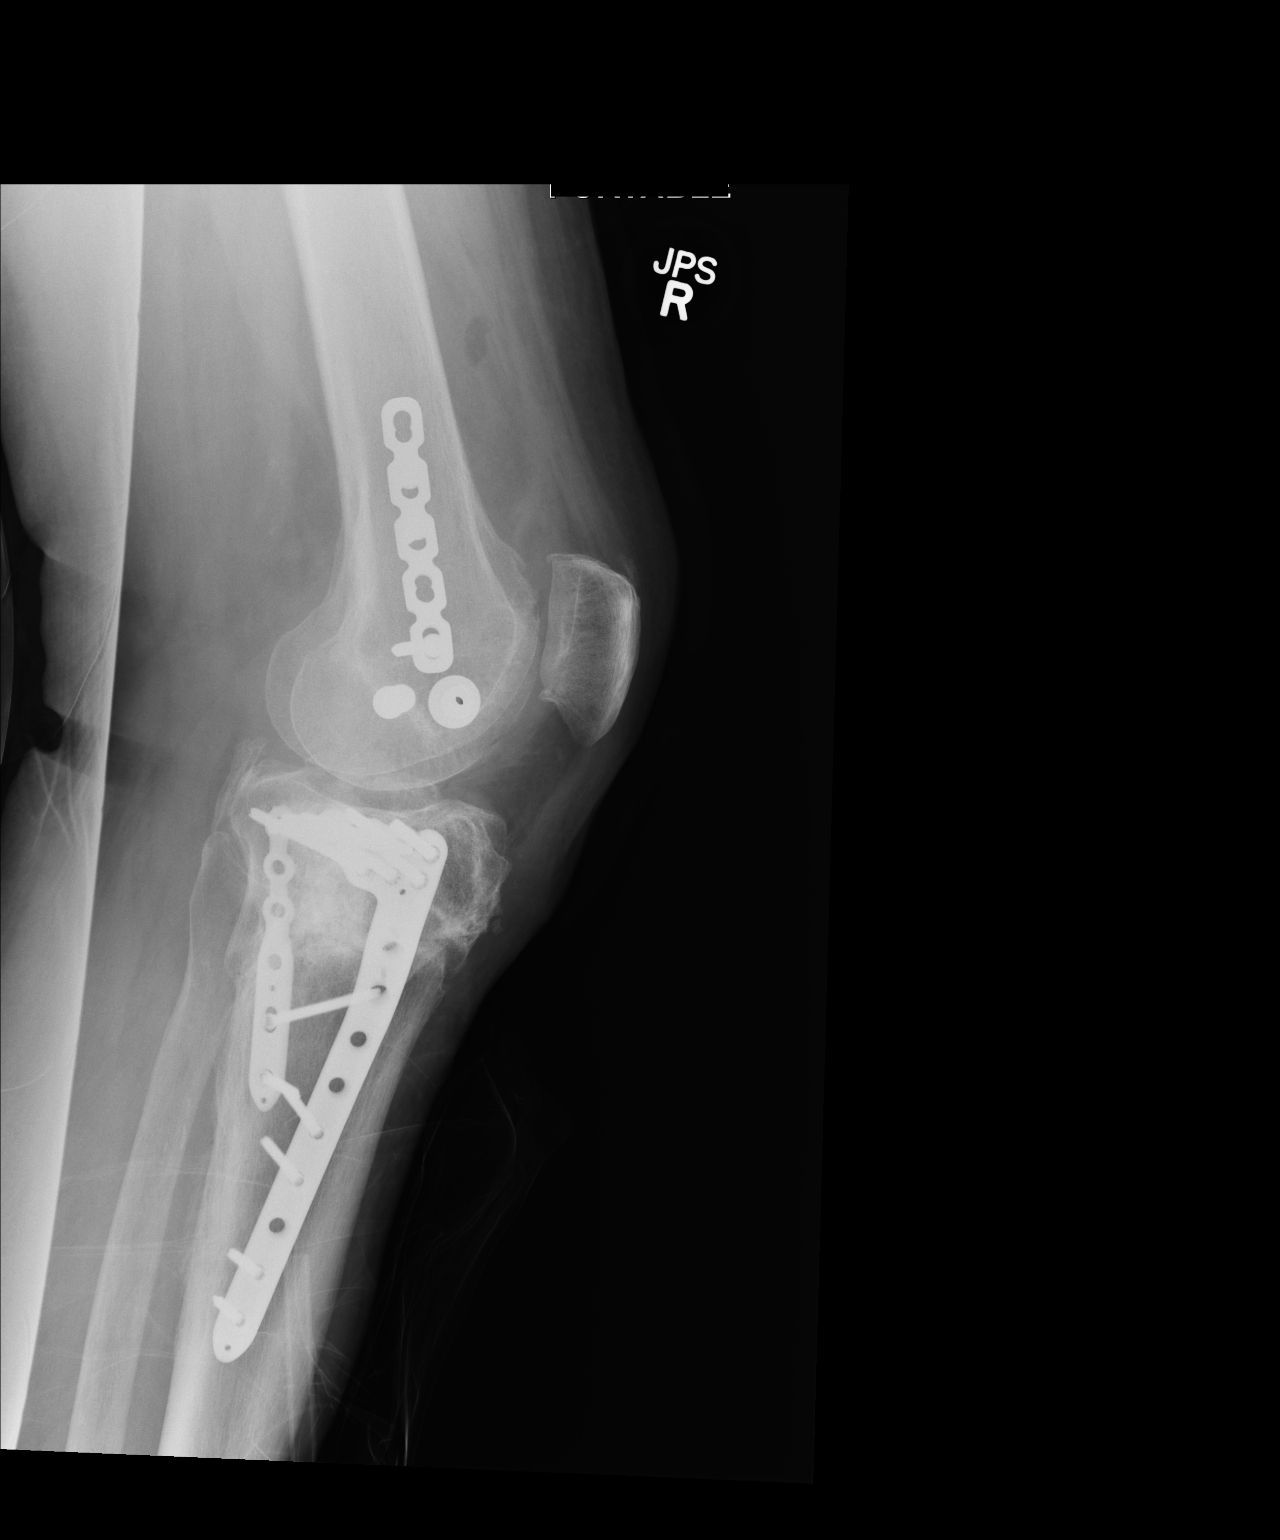

[AP]
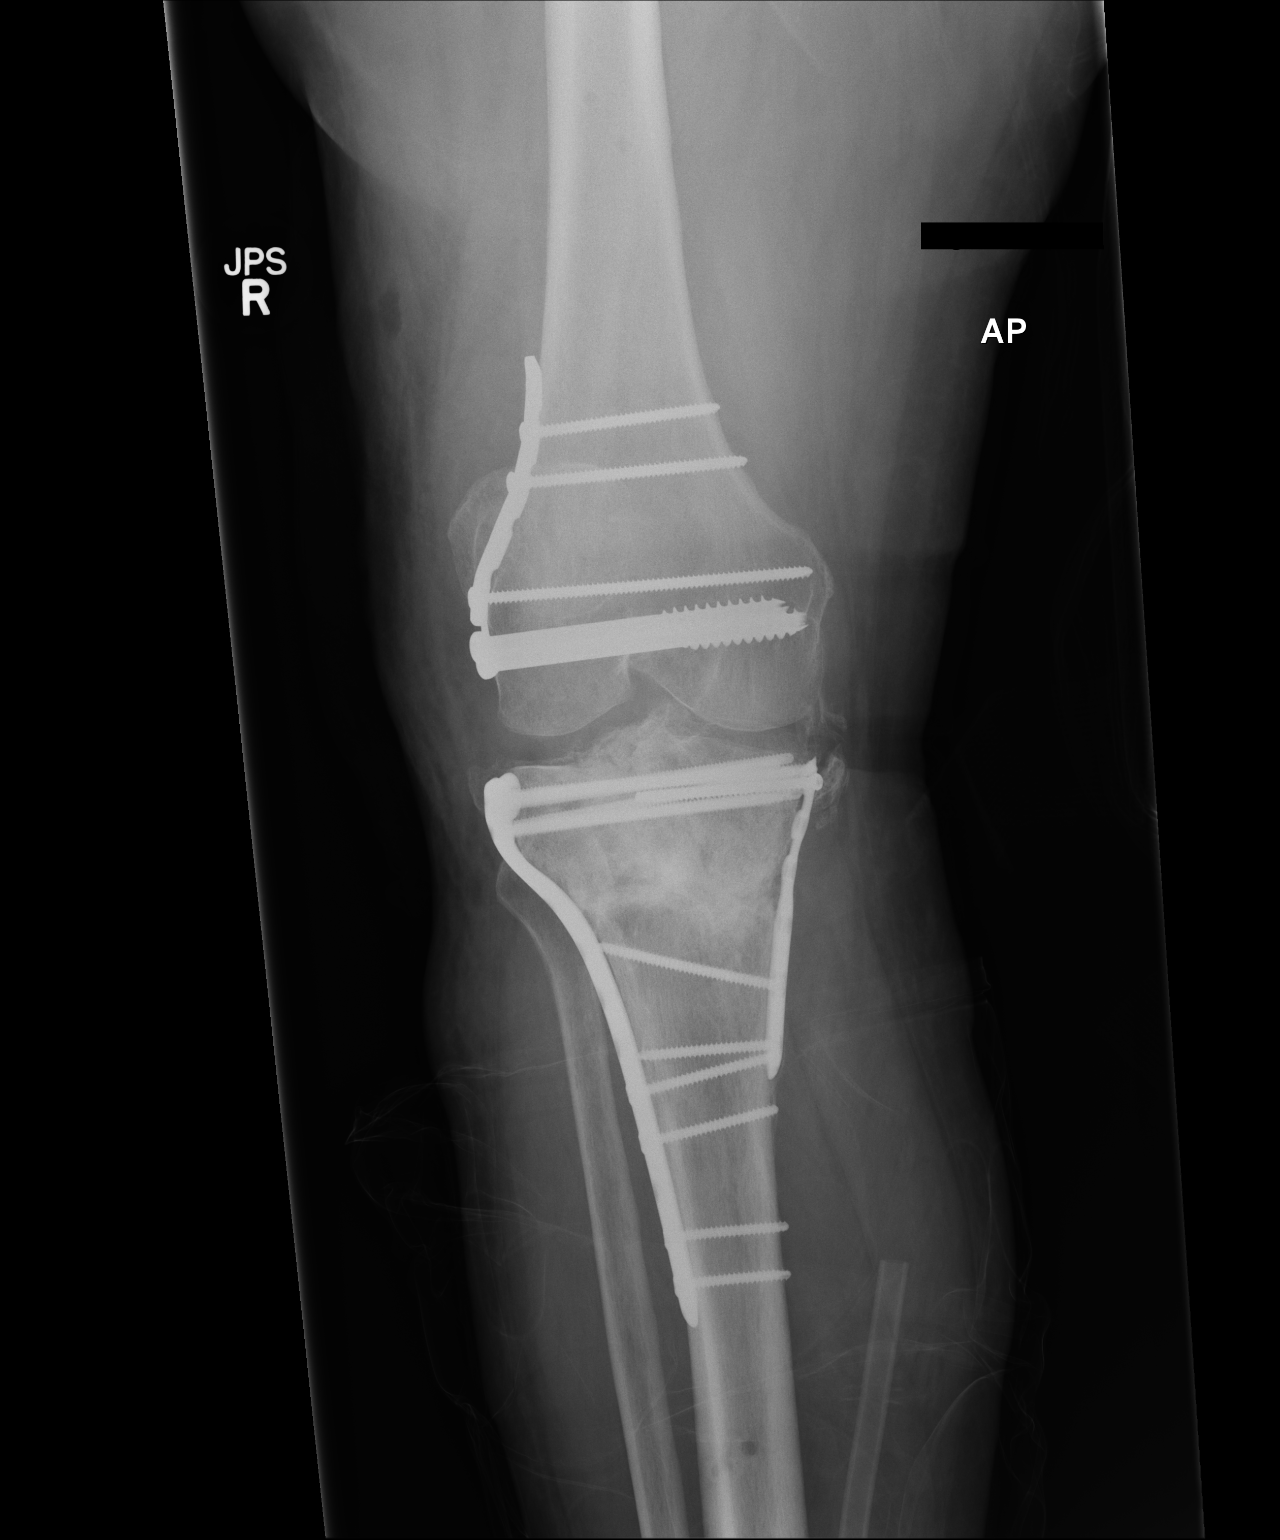

[2 of 2 positions shown; findings below may reference images not displayed]

FINDINGS: Frontal and lateral views were obtained. There is screw and plate
fixation through the distal femoral metaphysis and epiphysis regions
with alignment in these areas appearing anatomic. There is evidence
of old trauma involving the proximal tibia with extensive
postoperative change and remodeling. There is no new fracture. No
dislocation or appreciable effusion. There are multiple bony
fragments within the right knee joint, a stable finding.
IMPRESSION: Alignment in the distal femoral region is anatomic postoperatively.
Extensive postoperative and posttraumatic change in the proximal
tibia with multiple bony fragments within the knee joint, stable. No
dislocation. No appreciable joint effusion.

## 2016-05-23 IMAGING — RF DG C-ARM 61-120 MIN
1 series · 3 of 3 positions shown · non-contrast
Comparison: None.

CLINICAL DATA: Fracture

EXAM:
DG C-ARM 61-120 MIN

[Series 1: run · 3 of 3 slices shown]
[im 1/3]
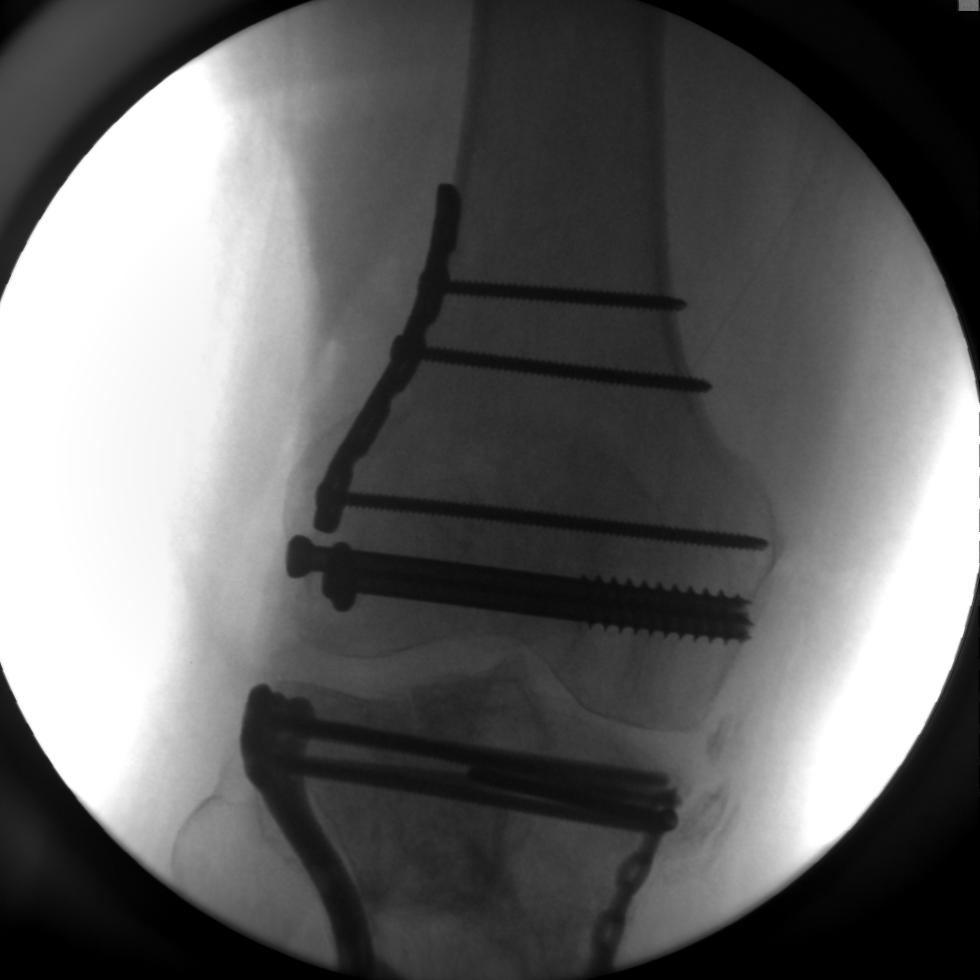
[im 2/3]
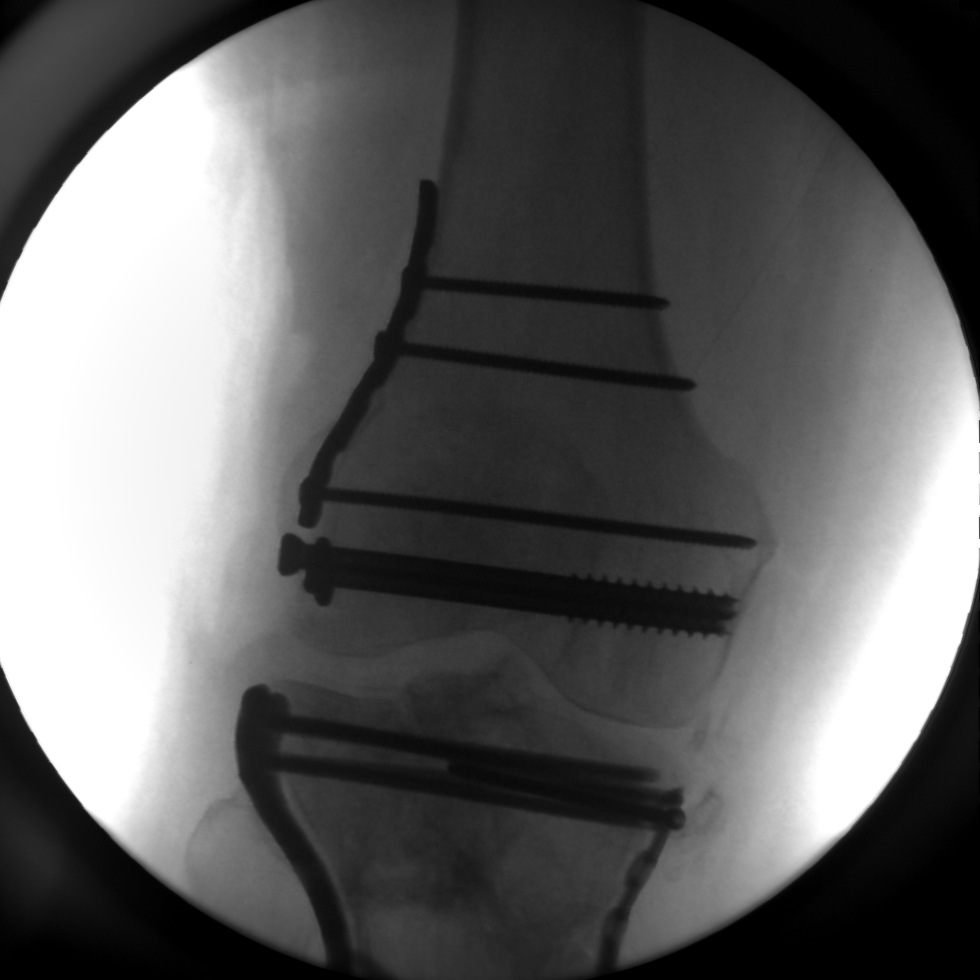
[im 3/3]
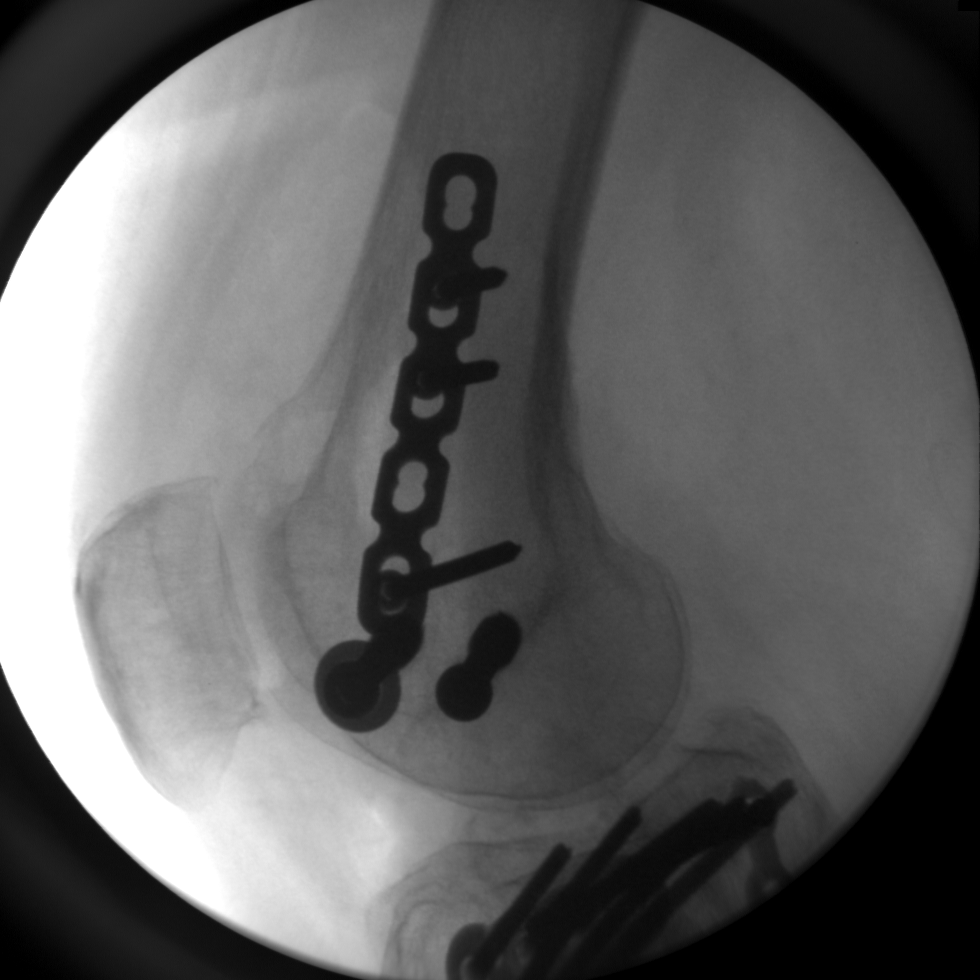

[3 of 3 positions shown; findings below may reference images not displayed]

FINDINGS: Fluoroscopy was utilized by this surgeon.
IMPRESSION: See above.
